# Patient Record
Sex: Female | Born: 1965 | Race: White | Hispanic: No | Marital: Single | Smoking: Former smoker
Health system: Southern US, Community
[De-identification: ages and names within clinical notes are randomized; demographics above are authoritative.]

## PROBLEM LIST (undated history)

## (undated) DIAGNOSIS — B019 Varicella without complication: Secondary | ICD-10-CM

## (undated) DIAGNOSIS — T7840XA Allergy, unspecified, initial encounter: Secondary | ICD-10-CM

## (undated) DIAGNOSIS — K759 Inflammatory liver disease, unspecified: Secondary | ICD-10-CM

## (undated) DIAGNOSIS — E785 Hyperlipidemia, unspecified: Secondary | ICD-10-CM

## (undated) HISTORY — DX: Varicella without complication: B01.9

## (undated) HISTORY — DX: Inflammatory liver disease, unspecified: K75.9

## (undated) HISTORY — DX: Allergy, unspecified, initial encounter: T78.40XA

## (undated) HISTORY — DX: Hyperlipidemia, unspecified: E78.5

## (undated) HISTORY — PX: BREAST BIOPSY: SHX20

---
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
## 1998-03-13 ENCOUNTER — Other Ambulatory Visit: Admission: RE | Admit: 1998-03-13 | Discharge: 1998-03-13 | Payer: Self-pay | Admitting: Gynecology

## 1999-10-19 ENCOUNTER — Other Ambulatory Visit: Admission: RE | Admit: 1999-10-19 | Discharge: 1999-10-19 | Payer: Self-pay | Admitting: Gynecology

## 2002-01-04 ENCOUNTER — Other Ambulatory Visit: Admission: RE | Admit: 2002-01-04 | Discharge: 2002-01-04 | Payer: Self-pay | Admitting: Gynecology

## 2004-03-03 ENCOUNTER — Other Ambulatory Visit: Admission: RE | Admit: 2004-03-03 | Discharge: 2004-03-03 | Payer: Self-pay | Admitting: Gynecology

## 2005-07-19 ENCOUNTER — Other Ambulatory Visit: Admission: RE | Admit: 2005-07-19 | Discharge: 2005-07-19 | Payer: Self-pay | Admitting: Gynecology

## 2013-07-04 ENCOUNTER — Other Ambulatory Visit: Payer: Self-pay | Admitting: Gynecology

## 2013-07-04 DIAGNOSIS — R928 Other abnormal and inconclusive findings on diagnostic imaging of breast: Secondary | ICD-10-CM

## 2013-07-12 ENCOUNTER — Ambulatory Visit
Admission: RE | Admit: 2013-07-12 | Discharge: 2013-07-12 | Disposition: A | Discharge status: Home / Self Care | Payer: BC Managed Care – PPO | Source: Ambulatory Visit | Attending: Gynecology | Admitting: Gynecology

## 2013-07-12 ENCOUNTER — Other Ambulatory Visit: Payer: Self-pay | Admitting: Gynecology

## 2013-07-12 DIAGNOSIS — R928 Other abnormal and inconclusive findings on diagnostic imaging of breast: Secondary | ICD-10-CM

## 2013-07-18 ENCOUNTER — Ambulatory Visit
Admission: RE | Admit: 2013-07-18 | Discharge: 2013-07-18 | Disposition: A | Discharge status: Home / Self Care | Payer: BC Managed Care – PPO | Source: Ambulatory Visit | Attending: Gynecology | Admitting: Gynecology

## 2013-07-18 DIAGNOSIS — R928 Other abnormal and inconclusive findings on diagnostic imaging of breast: Secondary | ICD-10-CM

## 2013-07-27 ENCOUNTER — Telehealth: Payer: Self-pay | Admitting: Internal Medicine

## 2013-07-27 NOTE — Telephone Encounter (Signed)
Pt request to be Dr. Alwyn RenHopper new pt. Pt was refer from Dr. Lodema HongScott Bowie and she was [facility]. Please advise.

## 2013-07-27 NOTE — Telephone Encounter (Signed)
OK  New patient to evaluate lipids

## 2013-07-27 NOTE — Telephone Encounter (Signed)
Pt will be called to schedule an appt.//AB/CMA

## 2013-08-03 ENCOUNTER — Encounter: Payer: Self-pay | Admitting: Internal Medicine

## 2013-08-03 ENCOUNTER — Ambulatory Visit (INDEPENDENT_AMBULATORY_CARE_PROVIDER_SITE_OTHER): Payer: BC Managed Care – PPO | Admitting: Internal Medicine

## 2013-08-03 VITALS — BP 120/68 | HR 62 | Temp 98.4°F | Resp 12 | Ht 65.25 in | Wt 146.6 lb

## 2013-08-03 DIAGNOSIS — E785 Hyperlipidemia, unspecified: Secondary | ICD-10-CM | POA: Insufficient documentation

## 2013-08-03 NOTE — Assessment & Plan Note (Addendum)
NMR Lipoprofile Panel CRP TSH

## 2013-08-03 NOTE — Patient Instructions (Addendum)
The best dietary  information on cholesterol is Dr Gildardo GriffesWillett's book Eat, Drink & Be Healthy. Cardiovascular exercise is recommended 30-45 minutes 3-4 times per week.

## 2013-08-03 NOTE — Progress Notes (Signed)
Pre visit review using our clinic review tool, if applicable. No additional management support is needed unless otherwise documented below in the visit note. 

## 2013-08-03 NOTE — Progress Notes (Signed)
   Subjective:    Patient ID: Jennifer Hughes, female    DOB: Oct 31, 1965, 48 y.o.   MRN: [id number]  HPI  Dr Lodema HongScott Bowie requested an assessment of elevated lipids. He did recommend that she take a statin; she was reluctant to do so.  Typically her HDL has been extremely high; the most recent HDL is 120. Her LDL was 170 with a total cholesterol of 302.  Her mother has a history of hyperlipidemia and may be on medications. Her father had a heart attack in his 5360s. There is no family history of CVA.Marland Kitchen.    Review of Systems A heart healthy diet is followed; exercise encompasses > 60 minutes 3-5  times per week as high level CVE & weights without symptoms.  Family history is negative for premature coronary disease. Advanced cholesterol testing not done to date. No statin to date.  Low dose ASA not taken Specifically denied are  chest pain, palpitations, dyspnea, or claudication.       Objective:   Physical Exam Appears healthy and well-nourished & in no acute distress  EOMI; fundi benign  No carotid bruits are present.No neck pain distention present at 10 - 15 degrees. Thyroid normal to palpation  Heart rhythm and rate are normal.Grade 1/6 systolic murmur Chest is clear with no increased work of breathing  Aorta palpable but there is no evidence of aortic aneurysm or renal artery bruits  Abdomen soft with no organomegaly or masses. No HJR  No clubbing, cyanosis or edema present.  Pedal pulses are intact   No ischemic skin changes are present . Nails healthy  Alert and oriented. Strength, tone, DTRs reflexes = but 1/2+ @ knees          Assessment & Plan:  See Current Assessment & Plan in Problem List under specific Diagnosis

## 2013-08-04 ENCOUNTER — Encounter: Payer: Self-pay | Admitting: Internal Medicine

## 2013-09-14 ENCOUNTER — Other Ambulatory Visit (INDEPENDENT_AMBULATORY_CARE_PROVIDER_SITE_OTHER): Payer: BC Managed Care – PPO

## 2013-09-14 ENCOUNTER — Other Ambulatory Visit: Payer: Self-pay | Admitting: Internal Medicine

## 2013-09-14 DIAGNOSIS — E785 Hyperlipidemia, unspecified: Secondary | ICD-10-CM

## 2013-09-14 LAB — NMR LIPOPROFILE WITH LIPIDS
CHOLESTEROL, TOTAL: 213 mg/dL — AB (ref <200)
HDL Particle Number: 36.6 umol/L (ref >=30.5)
HDL Size: 10.5 nm (ref >=9.2)
HDL-C: 90 mg/dL (ref >=40)
LARGE VLDL-P: 1 nmol/L (ref <=2.7)
LDL (calc): 111 mg/dL — ABNORMAL HIGH (ref <100)
LDL Particle Number: 885 nmol/L (ref <1000)
LDL Size: 21.8 nm (ref >20.5)
Large HDL-P: 17.5 umol/L (ref >=4.8)
Small LDL Particle Number: <90 nmol/L (ref <=527)
TRIGLYCERIDES: 60 mg/dL (ref <150)
VLDL Size: 50.1 nm — ABNORMAL HIGH (ref <=46.6)

## 2013-09-14 LAB — C-REACTIVE PROTEIN: CRP: <0.5 mg/dL (ref 0.5–20.0)

## 2013-09-14 LAB — TSH: TSH: 3.27 u[IU]/mL (ref 0.35–5.50)

## 2013-09-16 ENCOUNTER — Encounter: Payer: Self-pay | Admitting: Internal Medicine

## 2013-09-17 ENCOUNTER — Encounter: Payer: Self-pay | Admitting: *Deleted

## 2013-09-17 ENCOUNTER — Telehealth: Payer: Self-pay | Admitting: *Deleted

## 2013-09-17 NOTE — Telephone Encounter (Signed)
Message copied by Baldwin JamaicaJOHNSON, Eriverto Byrnes G on Mon Sep 17, 2013  9:48 AM ------      Message from: Pecola LawlessHOPPER, WILLIAM F      Created: Sun Sep 16, 2013 10:55 AM       Risk of premature heart attack or stroke increases as LDL or BAD cholesterol rises,especially if there is family history of heart attack in males before 1655 or women before 3365.       Based on this advanced testing, your LDL goal is < 160 , ideally < 130. Your present LDL is ideal without any increased long term heart attack or stroke risk. Congratulations! Hopp ------

## 2013-09-17 NOTE — Progress Notes (Signed)
Letter mailed with results. 

## 2013-09-17 NOTE — Telephone Encounter (Signed)
Letter sent with normal test results.  ?

## 2014-07-26 ENCOUNTER — Other Ambulatory Visit: Payer: Self-pay

## 2014-07-29 ENCOUNTER — Other Ambulatory Visit: Payer: Self-pay | Admitting: Obstetrics and Gynecology

## 2014-07-29 DIAGNOSIS — R921 Mammographic calcification found on diagnostic imaging of breast: Secondary | ICD-10-CM

## 2014-07-30 LAB — CYTOLOGY - PAP

## 2014-08-09 ENCOUNTER — Ambulatory Visit
Admission: RE | Admit: 2014-08-09 | Discharge: 2014-08-09 | Disposition: A | Discharge status: Home / Self Care | Payer: BLUE CROSS/BLUE SHIELD | Source: Ambulatory Visit | Attending: Obstetrics and Gynecology | Admitting: Obstetrics and Gynecology

## 2014-08-09 DIAGNOSIS — R921 Mammographic calcification found on diagnostic imaging of breast: Secondary | ICD-10-CM

## 2015-09-15 ENCOUNTER — Other Ambulatory Visit: Payer: Self-pay | Admitting: Obstetrics and Gynecology

## 2015-09-16 LAB — CYTOLOGY - PAP

## 2016-09-29 ENCOUNTER — Other Ambulatory Visit: Payer: Self-pay | Admitting: Obstetrics and Gynecology

## 2016-09-30 LAB — CYTOLOGY - PAP

## 2016-12-17 ENCOUNTER — Encounter (HOSPITAL_BASED_OUTPATIENT_CLINIC_OR_DEPARTMENT_OTHER): Payer: Self-pay | Admitting: Emergency Medicine

## 2016-12-17 ENCOUNTER — Emergency Department (HOSPITAL_BASED_OUTPATIENT_CLINIC_OR_DEPARTMENT_OTHER)
Admission: EM | Admit: 2016-12-17 | Discharge: 2016-12-17 | Disposition: A | Discharge status: Home / Self Care | Payer: BLUE CROSS/BLUE SHIELD | Attending: Emergency Medicine | Admitting: Emergency Medicine

## 2016-12-17 DIAGNOSIS — Z87891 Personal history of nicotine dependence: Secondary | ICD-10-CM | POA: Insufficient documentation

## 2016-12-17 DIAGNOSIS — R197 Diarrhea, unspecified: Secondary | ICD-10-CM | POA: Diagnosis present

## 2016-12-17 DIAGNOSIS — A09 Infectious gastroenteritis and colitis, unspecified: Secondary | ICD-10-CM | POA: Diagnosis not present

## 2016-12-17 DIAGNOSIS — Z79899 Other long term (current) drug therapy: Secondary | ICD-10-CM | POA: Diagnosis not present

## 2016-12-17 LAB — COMPREHENSIVE METABOLIC PANEL
ALBUMIN: 3.9 g/dL (ref 3.5–5.0)
ALK PHOS: 63 U/L (ref 38–126)
ALT: 19 U/L (ref 14–54)
AST: 23 U/L (ref 15–41)
Anion gap: 9 (ref 5–15)
BUN: 14 mg/dL (ref 6–20)
CALCIUM: 9 mg/dL (ref 8.9–10.3)
CO2: 25 mmol/L (ref 22–32)
Chloride: 102 mmol/L (ref 101–111)
Creatinine, Ser: 0.81 mg/dL (ref 0.44–1.00)
GFR calc Af Amer: >60 mL/min (ref >60)
GFR calc non Af Amer: >60 mL/min (ref >60)
GLUCOSE: 89 mg/dL (ref 65–99)
Potassium: 3.6 mmol/L (ref 3.5–5.1)
Sodium: 136 mmol/L (ref 135–145)
Total Bilirubin: 0.5 mg/dL (ref 0.3–1.2)
Total Protein: 6.7 g/dL (ref 6.5–8.1)

## 2016-12-17 LAB — CBC WITH DIFFERENTIAL/PLATELET
BASOS PCT: 0 %
Basophils Absolute: 0 10*3/uL (ref 0.0–0.1)
EOS ABS: 0.5 10*3/uL (ref 0.0–0.7)
Eosinophils Relative: 6 %
HEMATOCRIT: 38.6 % (ref 36.0–46.0)
Hemoglobin: 13.6 g/dL (ref 12.0–15.0)
Lymphocytes Relative: 37 %
Lymphs Abs: 2.8 10*3/uL (ref 0.7–4.0)
MCH: 32.6 pg (ref 26.0–34.0)
MCHC: 35.2 g/dL (ref 30.0–36.0)
MCV: 92.6 fL (ref 78.0–100.0)
MONOS PCT: 10 %
Monocytes Absolute: 0.7 10*3/uL (ref 0.1–1.0)
NEUTROS ABS: 3.6 10*3/uL (ref 1.7–7.7)
NEUTROS PCT: 47 %
Platelets: 210 10*3/uL (ref 150–400)
RBC: 4.17 MIL/uL (ref 3.87–5.11)
RDW: 12.1 % (ref 11.5–15.5)
WBC: 7.6 10*3/uL (ref 4.0–10.5)

## 2016-12-17 LAB — LIPASE, BLOOD: Lipase: 30 U/L (ref 11–51)

## 2016-12-17 MED ORDER — SODIUM CHLORIDE 0.9 % IV BOLUS (SEPSIS)
1000.0000 mL | Freq: Once | INTRAVENOUS | Status: AC
Start: 2016-12-17 — End: 2016-12-17
  Administered 2016-12-17: 1000 mL via INTRAVENOUS

## 2016-12-17 MED ORDER — DICYCLOMINE HCL 20 MG PO TABS
20.0000 mg | ORAL_TABLET | Freq: Once | ORAL | Status: DC
  Filled 2016-12-17: qty 1

## 2016-12-17 MED ORDER — ONDANSETRON 4 MG PO TBDP: 4.0000 mg | ORAL_TABLET | Freq: Three times a day (TID) | ORAL | 0 refills | Status: DC | PRN

## 2016-12-17 MED ORDER — ONDANSETRON HCL 4 MG/2ML IJ SOLN
4.0000 mg | Freq: Once | INTRAMUSCULAR | Status: AC
  Administered 2016-12-17: 4 mg via INTRAVENOUS
  Filled 2016-12-17: qty 2

## 2016-12-17 MED ORDER — DICYCLOMINE HCL 10 MG PO CAPS
20.0000 mg | ORAL_CAPSULE | Freq: Once | ORAL | Status: AC
  Administered 2016-12-17: 20 mg via ORAL

## 2016-12-17 MED ORDER — CIPROFLOXACIN HCL 500 MG PO TABS: 500.0000 mg | ORAL_TABLET | Freq: Two times a day (BID) | ORAL | 0 refills | Status: AC

## 2016-12-17 MED ORDER — SODIUM CHLORIDE 0.9 % IV BOLUS (SEPSIS): 500.0000 mL | Freq: Once | INTRAVENOUS | Status: DC

## 2016-12-17 MED ORDER — AZITHROMYCIN 250 MG PO TABS
1000.0000 mg | ORAL_TABLET | Freq: Once | ORAL | Status: AC
  Administered 2016-12-17: 1000 mg via ORAL
  Filled 2016-12-17: qty 4

## 2016-12-17 MED ORDER — DICYCLOMINE HCL 10 MG PO CAPS
ORAL_CAPSULE | ORAL | Status: AC
  Filled 2016-12-17: qty 1

## 2016-12-17 NOTE — ED Provider Notes (Signed)
[facility] Provider Note   CSN: [id number] Arrival date & time: 12/17/16  1652   By signing my name below, I, Clarisse Gouge, attest that this documentation has been prepared under the direction and in the presence of Shaune Pollack, MD. Electronically signed, Clarisse Gouge, ED Scribe. 12/17/16. 6:36 PM.   History   Chief Complaint Chief Complaint  Patient presents with  . Diarrhea   The history is provided by the patient and medical records. No language interpreter was used.    Jennifer Hughes is a 51 y.o. female presenting to the Emergency Department concerning abdominal cramping x ~5 days. She describes intermittent, 5/10, bilateral, upper to middle abdominal pains. Fatigue, decreased appetite, N/V x 5 days, diarrhea x 3 days with some blood streaks noted. Pt [place] returned from [place] at 1-2 AM 5 days ago. She states she was there x 7 days and she was bitten by many mosquitos during the vacation. No medication use during aforementioned trip. She states she swam in a pool and the ocean. She denies known sick contacts at home or otherwise, but she states 2 of her travel mates had similar symptoms x 1 day that subsided during the trip. No fevers, hematemesis, dysuria, hematuria or frequency noted. No other complaints at this time.   Past Medical History:  Diagnosis Date  . Allergy    hayfever   . Chickenpox   . Hepatitis   . Hyperlipidemia     Patient Active Problem List   Diagnosis Date Noted  . Other and unspecified hyperlipidemia 08/03/2013    Past Surgical History:  Procedure Laterality Date  . BREAST BIOPSY    . TONSILLECTOMY AND ADENOIDECTOMY  1973    OB History    No data available       Home Medications    Prior to Admission medications   Medication Sig Start Date End Date Taking? Authorizing Provider  valACYclovir (VALTREX) 500 MG tablet Take 500 mg by mouth 2 (two) times daily.   Yes [provider]  azithromycin  (ZITHROMAX) 250 MG tablet Take 250 mg by mouth daily.    [provider]  ciprofloxacin (CIPRO) 500 MG tablet Take 1 tablet (500 mg total) by mouth 2 (two) times daily. 12/17/16 12/20/16  Shaune Pollack, MD  Misc Natural Products (CHOLESTEROL SUPPORT PO) Take 3 capsules by mouth daily.    [provider]  Multiple Vitamins-Minerals (MULTIPLE VITAMINS/WOMENS PO) Take 1 capsule by mouth daily.    [provider]  Omega-3 Fatty Acids (OMEGA-3 FISH OIL PO) Take 3 capsules by mouth daily.    [provider]  ondansetron (ZOFRAN ODT) 4 MG disintegrating tablet Take 1 tablet (4 mg total) by mouth every 8 (eight) hours as needed for nausea or vomiting. 12/17/16   Shaune Pollack, MD    Family History Family History  Problem Relation Age of Onset  . Hyperlipidemia Mother   . Cancer Mother        gynecologic  . Heart attack Father        in 90s  . Diabetes Maternal Grandmother   . Stroke Neg Hx     Social History Social History  Substance Use Topics  . Smoking status: Former Smoker    Quit date: 08/03/1998  . Smokeless tobacco: Never Used     Comment: smoked 1998-2000, up to 10 cigarettes / week  . Alcohol use 4.2 oz/week    7 Glasses of wine per week     Comment: Wine  1 glass/day     Allergies   Patient has no known allergies.   Review of Systems Review of Systems  Constitutional: Positive for appetite change and fatigue. Negative for fever.  Gastrointestinal: Positive for abdominal pain, blood in stool, diarrhea, nausea and vomiting.  Genitourinary: Negative for dysuria, frequency and hematuria.  All other systems reviewed and are negative.    Physical Exam Updated Vital Signs BP 121/77 (BP Location: Right Arm)   Pulse 67   Temp 99 F (37.2 C) (Oral)   Resp 18   Ht 5\' 6"  (1.676 m)   Wt 152 lb (68.9 kg)   LMP 07/22/2016   SpO2 99%   BMI 24.53 kg/m   Physical Exam  Constitutional: She is oriented to person, place, and time. She  appears well-developed and well-nourished. No distress.  HENT:  Head: Normocephalic and atraumatic.  Mouth/Throat: Mucous membranes are dry (mildly dry mucous membrane).  Eyes: Conjunctivae are normal.  Neck: Neck supple.  Cardiovascular: Normal rate, regular rhythm and normal heart sounds.  Exam reveals no friction rub.   No murmur heard. Pulmonary/Chest: Effort normal and breath sounds normal. No respiratory distress. She has no wheezes. She has no rales.  Abdominal: Soft. She exhibits no distension. Bowel sounds are increased. There is no tenderness. There is no rebound and no guarding.  Musculoskeletal: She exhibits no edema.  Neurological: She is alert and oriented to person, place, and time. She exhibits normal muscle tone.  Skin: Skin is warm. Capillary refill takes less than 2 seconds.  Psychiatric: She has a normal mood and affect.  Nursing note and vitals reviewed.    ED Treatments / Results  DIAGNOSTIC STUDIES: Oxygen Saturation is 99% on RA, NL by my interpretation.    COORDINATION OF CARE: 6:26 PM-Discussed next steps with pt. Pt verbalized understanding and is agreeable with the plan. Will order fluids and medication.   Labs (all labs ordered are listed, but only abnormal results are displayed) Labs Reviewed  CBC WITH DIFFERENTIAL/PLATELET  COMPREHENSIVE METABOLIC PANEL  LIPASE, BLOOD    EKG  EKG Interpretation None       Radiology No results found.  Procedures Procedures (including critical care time)  Emergency Ultrasound Study:   Angiocath insertion Performed by: Dollene Cleveland Consent: Verbal consent/emergent consent obtained. Risks and benefits: risks, benefits and alternatives were discussed Immediately prior to procedure the correct patient, procedure, equipment, support staff and site/side marked as needed.  Indication: difficult IV access Preparation: Patient was prepped and draped in the usual sterile fashion. Sterile gel was used for  this procedure and the ultrasound probe was sterilized prior to use. Vein Location: Right forearm vein was visualized during assessment for potential access sites and was found to be patent/ easily compressed with linear ultrasound.  The needle was visualized with real-time ultrasound and guided into the vein. Gauge: 20  Image saved and stored.  Normal blood return.   Patient tolerance: Patient tolerated the procedure well with no immediate complications.       Medications Ordered in ED Medications  sodium chloride 0.9 % bolus 1,000 mL (0 mLs Intravenous Stopped 12/17/16 1936)  ondansetron (ZOFRAN) injection 4 mg (4 mg Intravenous Given 12/17/16 1842)  dicyclomine (BENTYL) capsule 20 mg (20 mg Oral Given 12/17/16 1850)  azithromycin (ZITHROMAX) tablet 1,000 mg (1,000 mg Oral Given 12/17/16 1936)     Initial Impression / Assessment and Plan / ED Course  I have reviewed the triage vital signs and the nursing notes.  Pertinent  labs & imaging results that were available during my care of the patient were reviewed by me and considered in my medical decision making (see chart for details).     51 year old female here with watery diarrhea after recent travel to [place]. On exam, the patient is very well-appearing, though mildly dehydrated. She has no urinary symptoms. CBC, CMP, and lipase are within normal limits. She feels better after IV fluids. I suspect patient likely has traveler's diarrhea or Escherichia coli infection. No evidence of significant invasive infection. No evidence sepsis. Given the absence of any leukocytosis or tenderness on exam, I do not feel CT imaging is indicated. Will give azithromycin as well as 3 day course of Cipro and discharged with outpatient follow-up.  Final Clinical Impressions(s) / ED Diagnoses   Final diagnoses:  Traveler's diarrhea    New Prescriptions Discharge Medication List as of 12/17/2016  8:25 PM    START taking these medications   Details    ciprofloxacin (CIPRO) 500 MG tablet Take 1 tablet (500 mg total) by mouth 2 (two) times daily., Starting Fri 12/17/2016, Until Mon 12/20/2016, Print        I personally performed the services described in this documentation, which was scribed in my presence. The recorded information has been reviewed and is accurate.    Shaune PollackIsaacs, Finis Hendricksen, MD 12/18/16 [phone]

## 2016-12-17 NOTE — ED Triage Notes (Signed)
pateint states that she was in [place] last week. On Sunday when she got back she was fine and then on Monday she had abdominal cramping and N/V - wed she started with the darrhea. She is concerned that she picked something up in [place]

## 2016-12-17 NOTE — ED Notes (Signed)
ED Provider at bedside. 

## 2016-12-17 NOTE — ED Notes (Signed)
Came back from [place] Saturday night.  Monday, felt more tired and stomach cramping accompanied by diarrhea.  3 loose watery stools within 24 hours.  Still c/o cramping and took imodium Wednesday and yesterday with no relief.

## 2020-02-27 LAB — HM PAP SMEAR: HM Pap smear: NORMAL

## 2020-02-29 LAB — HM MAMMOGRAPHY

## 2021-02-19 ENCOUNTER — Telehealth: Payer: Self-pay | Admitting: *Deleted

## 2021-02-19 ENCOUNTER — Ambulatory Visit (INDEPENDENT_AMBULATORY_CARE_PROVIDER_SITE_OTHER): Payer: Managed Care, Other (non HMO) | Admitting: Family

## 2021-02-19 ENCOUNTER — Encounter: Payer: Self-pay | Admitting: Family

## 2021-02-19 ENCOUNTER — Other Ambulatory Visit: Payer: Self-pay

## 2021-02-19 VITALS — BP 112/60 | HR 60 | Temp 97.9°F | Resp 16 | Ht 66.0 in | Wt 168.6 lb

## 2021-02-19 DIAGNOSIS — Z13220 Encounter for screening for lipoid disorders: Secondary | ICD-10-CM | POA: Diagnosis not present

## 2021-02-19 DIAGNOSIS — Z0000 Encounter for general adult medical examination without abnormal findings: Secondary | ICD-10-CM | POA: Diagnosis not present

## 2021-02-19 NOTE — Progress Notes (Signed)
Jennifer Hughes is a 55 y.o. female with the following history as recorded in Epic[employer]:  Patient Active Problem List   Diagnosis Date Noted   Other and unspecified hyperlipidemia 08/03/2013    Current Outpatient Medications  Medication Sig Dispense Refill   B Complex Vitamins (B COMPLEX PO) Take by mouth.     Multiple Vitamins-Minerals (MULTIPLE VITAMINS/WOMENS PO) Take 1 capsule by mouth daily.     Specialty Vitamins Products (MM BIOTIN/KERATIN PO) Take by mouth.     valACYclovir (VALTREX) 500 MG tablet Take 500 mg by mouth daily.     VITAMIN E BLEND PO Take by mouth.     No current facility-administered medications for this visit.    Allergies: Patient has no known allergies.  Past Medical History:  Diagnosis Date   Allergy    hayfever    Chickenpox    Hepatitis    Hyperlipidemia     Past Surgical History:  Procedure Laterality Date   BREAST BIOPSY     TONSILLECTOMY AND ADENOIDECTOMY  1973    Family History  Problem Relation Age of Onset   Hyperlipidemia Mother    Cancer Mother        gynecologic   Heart attack Father        in 1s   Diabetes Maternal Grandmother    Stroke Neg Hx     Social History   Tobacco Use   Smoking status: Former    Types: Cigarettes    Quit date: 08/03/1998    Years since quitting: 22.5   Smokeless tobacco: Never   Tobacco comments:    smoked 1998-2000, up to 10 cigarettes / week  Substance Use Topics   Alcohol use: Yes    Alcohol/week: 7.0 standard drinks    Types: 7 Glasses of wine per week    Comment: Wine 1 glass/day    Subjective:  Presents as a new patient; no acute concerns- decided it was time to have a primary care provider; Sees GYN regularly- Dr. Rogue Bussing; Colonoscopy done at age 4- okay for 10 year repeat;  Up to date on dental exams; overdue for eye exam; Exercises 3 x per week with personal trainer;  Review of Systems  Constitutional: Negative.   HENT: Negative.    Eyes: Negative.   Respiratory: Negative.     Cardiovascular: Negative.   Gastrointestinal: Negative.   Genitourinary: Negative.   Musculoskeletal: Negative.   Skin: Negative.   Neurological: Negative.   Endo/Heme/Allergies: Negative.   Psychiatric/Behavioral: Negative.          Objective:  Vitals:   02/19/21 1409  BP: 112/60  Pulse: 60  Resp: 16  Temp: 97.9 F (36.6 C)  TempSrc: Oral  SpO2: 98%  Weight: 168 lb 9.6 oz (76.5 kg)  Height: _0  (1.676 m)    General: Well developed, well nourished, in no acute distress  Skin : Warm and dry.  Head: Normocephalic and atraumatic  Eyes: Sclera and conjunctiva clear; pupils round and reactive to light; extraocular movements intact  Ears: External normal; canals clear; tympanic membranes normal  Oropharynx: Pink, supple. No suspicious lesions  Neck: Supple without thyromegaly, adenopathy  Lungs: Respirations unlabored; clear to auscultation bilaterally without wheeze, rales, rhonchi  CVS exam: normal rate and regular rhythm.  Abdomen: Soft; nontender; nondistended; normoactive bowel sounds; no masses or hepatosplenomegaly  Musculoskeletal: No deformities; no active joint inflammation  Extremities: No edema, cyanosis, clubbing  Vessels: Symmetric bilaterally  Neurologic: Alert and oriented; speech intact; face symmetrical; moves all  extremities well; CNII-XII intact without focal deficit   Assessment:  1. PE (physical exam), annual   2. Lipid screening     Plan:  Age appropriate preventive healthcare needs addressed; encouraged regular eye doctor and dental exams; encouraged regular exercise; she will return for fasting labs and follow-up to be determined; Discussed vaccines including Tdap, Shingrix and flu; she defers at this time; Congratulated patient on commitment to her health;   This visit occurred during the SARS-CoV-2 public health emergency.  Safety protocols were in place, including screening questions prior to the visit, additional usage of staff PPE, and  extensive cleaning of exam room while observing appropriate contact time as indicated for disinfecting solutions.    Return for fasting labs at patient's convenience.  Orders Placed This Encounter  Procedures   CBC with Differential/Platelet    Standing Status:   Future    Standing Expiration Date:   02/19/2022   Comp Met (CMET)    Standing Status:   Future    Standing Expiration Date:   02/19/2022   Lipid panel    Standing Status:   Future    Standing Expiration Date:   02/19/2022   TSH    Standing Status:   Future    Standing Expiration Date:   02/19/2022     Requested Prescriptions    No prescriptions requested or ordered in this encounter

## 2021-02-19 NOTE — Telephone Encounter (Signed)
Pt is scheduled for lab appointment tomorrow morning. I do not see future orders.  Please place orders if appropriate.

## 2021-02-20 ENCOUNTER — Other Ambulatory Visit: Payer: Self-pay | Admitting: Family

## 2021-02-20 ENCOUNTER — Encounter: Payer: Self-pay | Admitting: Family

## 2021-02-20 ENCOUNTER — Other Ambulatory Visit (INDEPENDENT_AMBULATORY_CARE_PROVIDER_SITE_OTHER): Payer: Managed Care, Other (non HMO)

## 2021-02-20 DIAGNOSIS — Z0000 Encounter for general adult medical examination without abnormal findings: Secondary | ICD-10-CM

## 2021-02-20 DIAGNOSIS — Z13220 Encounter for screening for lipoid disorders: Secondary | ICD-10-CM | POA: Diagnosis not present

## 2021-02-20 DIAGNOSIS — E7800 Pure hypercholesterolemia, unspecified: Secondary | ICD-10-CM

## 2021-02-20 LAB — LIPID PANEL
Cholesterol: 343 mg/dL — ABNORMAL HIGH (ref 0–200)
HDL: 98.7 mg/dL (ref >39.00)
LDL Cholesterol: 233 mg/dL — ABNORMAL HIGH (ref 0–99)
NonHDL: 243.98
Total CHOL/HDL Ratio: 3
Triglycerides: 55 mg/dL (ref 0.0–149.0)
VLDL: 11 mg/dL (ref 0.0–40.0)

## 2021-02-20 LAB — COMPREHENSIVE METABOLIC PANEL
ALT: 23 U/L (ref 0–35)
AST: 28 U/L (ref 0–37)
Albumin: 4.4 g/dL (ref 3.5–5.2)
Alkaline Phosphatase: 80 U/L (ref 39–117)
BUN: 13 mg/dL (ref 6–23)
CO2: 26 mEq/L (ref 19–32)
Calcium: 9.7 mg/dL (ref 8.4–10.5)
Chloride: 101 mEq/L (ref 96–112)
Creatinine, Ser: 0.91 mg/dL (ref 0.40–1.20)
GFR: 70.14 mL/min (ref >60.00)
Glucose, Bld: 84 mg/dL (ref 70–99)
Potassium: 4.4 mEq/L (ref 3.5–5.1)
Sodium: 136 mEq/L (ref 135–145)
Total Bilirubin: 0.8 mg/dL (ref 0.2–1.2)
Total Protein: 7 g/dL (ref 6.0–8.3)

## 2021-02-20 LAB — CBC WITH DIFFERENTIAL/PLATELET
Basophils Absolute: 0.1 10*3/uL (ref 0.0–0.1)
Basophils Relative: 1.1 % (ref 0.0–3.0)
Eosinophils Absolute: 0.3 10*3/uL (ref 0.0–0.7)
Eosinophils Relative: 4.4 % (ref 0.0–5.0)
HCT: 39.9 % (ref 36.0–46.0)
Hemoglobin: 13.6 g/dL (ref 12.0–15.0)
Lymphocytes Relative: 34.4 % (ref 12.0–46.0)
Lymphs Abs: 2.2 10*3/uL (ref 0.7–4.0)
MCHC: 34.1 g/dL (ref 30.0–36.0)
MCV: 95.7 fl (ref 78.0–100.0)
Monocytes Absolute: 0.4 10*3/uL (ref 0.1–1.0)
Monocytes Relative: 6.3 % (ref 3.0–12.0)
Neutro Abs: 3.4 10*3/uL (ref 1.4–7.7)
Neutrophils Relative %: 53.8 % (ref 43.0–77.0)
Platelets: 248 10*3/uL (ref 150.0–400.0)
RBC: 4.17 Mil/uL (ref 3.87–5.11)
RDW: 12.3 % (ref 11.5–15.5)
WBC: 6.3 10*3/uL (ref 4.0–10.5)

## 2021-02-20 LAB — TSH: TSH: 2.74 u[IU]/mL (ref 0.35–5.50)

## 2021-02-20 NOTE — Progress Notes (Signed)
Mammogram and PAP abstraction done.

## 2021-02-26 ENCOUNTER — Encounter: Payer: Self-pay | Admitting: Family

## 2021-07-13 ENCOUNTER — Other Ambulatory Visit: Payer: Self-pay

## 2021-07-13 ENCOUNTER — Ambulatory Visit (HOSPITAL_BASED_OUTPATIENT_CLINIC_OR_DEPARTMENT_OTHER): Payer: Managed Care, Other (non HMO) | Admitting: Internal Medicine

## 2021-07-13 ENCOUNTER — Encounter (HOSPITAL_BASED_OUTPATIENT_CLINIC_OR_DEPARTMENT_OTHER): Payer: Self-pay | Admitting: Internal Medicine

## 2021-07-13 VITALS — BP 115/64 | HR 57 | Ht 66.0 in | Wt 161.0 lb

## 2021-07-13 DIAGNOSIS — E7800 Pure hypercholesterolemia, unspecified: Secondary | ICD-10-CM

## 2021-07-13 MED ORDER — ROSUVASTATIN CALCIUM 20 MG PO TABS: 20.0000 mg | ORAL_TABLET | Freq: Every day | ORAL | 3 refills | Status: DC

## 2021-07-13 NOTE — Progress Notes (Signed)
LIPID CLINIC CONSULT NOTE  Chief Complaint:  Manage dyslipidemia  Primary Care Physician: Marrian Salvage, [place]  Primary Cardiologist:  None  HPI:  Jennifer Hughes is a 56 y.o. female who is being seen today for the evaluation of anemia at the request of Marrian Salvage,*.  Is a pleasant 56 year old female kindly referred for evaluation and management of dyslipidemia.  She was initially noted to have high cholesterol number of years ago and referred for further evaluation.  She saw Dr. Linna Darner, local primary care provider who ordered an NMR LipoProfile.  This demonstrated an LDL particle number of 885, LDL-C of 111, HDL of 90 and triglycerides 60.  Total cholesterol 213.  At the time it was felt that her ratio was cardioprotective and no additional therapy was recommended.  She is currently and has been on no medication to treat her lipids, in fact she is on no medications at all.  She says she is feels that she is fairly healthy.  She is active and exercises regularly.  She is maintaining a normal body weight and has no history of hypertension or prediabetes, etc.  Recently she had repeat lipids, however which were concerning showing total cholesterol 343, triglycerides 55, HDL 98 and LDL 233.  Based on this finding, she was referred for further evaluation.  She does report history of heart disease in her family including her father who had MI in his 5s and mother who had a stroke in probably her 82s.  I believe she had high cholesterol.  PMHx:  Past Medical History:  Diagnosis Date   Allergy    hayfever    Chickenpox    Hepatitis    Hyperlipidemia     Past Surgical History:  Procedure Laterality Date   BREAST BIOPSY     TONSILLECTOMY AND ADENOIDECTOMY  1973    FAMHx:  Family History  Problem Relation Age of Onset   Hyperlipidemia Mother    Cancer Mother        gynecologic   Heart attack Father        in 61s   Diabetes Maternal Grandmother    Stroke Neg Hx      SOCHx:   reports that she quit smoking about 22 years ago. Her smoking use included cigarettes. She has never used smokeless tobacco. She reports current alcohol use of about 7.0 standard drinks per week. She reports that she does not use drugs.  ALLERGIES:  Allergies  Allergen Reactions   Codeine Nausea Only    ROS: Pertinent items noted in HPI and remainder of comprehensive ROS otherwise negative.  HOME MEDS: Current Outpatient Medications on File Prior to Visit  Medication Sig Dispense Refill   Multiple Vitamins-Minerals (MULTIPLE VITAMINS/WOMENS PO) Take 1 capsule by mouth daily.     Specialty Vitamins Products (MM BIOTIN/KERATIN PO) Take by mouth.     valACYclovir (VALTREX) 500 MG tablet Take 500 mg by mouth daily.     No current facility-administered medications on file prior to visit.    LABS/IMAGING: No results found for this or any previous visit (from the past 48 hour(s)). No results found.  LIPID PANEL:    Component Value Date/Time   CHOL 343 (H) 02/20/2021 1019   CHOL 213 (H) 09/14/2013 0823   TRIG 55.0 02/20/2021 1019   TRIG 60 09/14/2013 0823   HDL 98.70 02/20/2021 1019   HDL 90 09/14/2013 0823   CHOLHDL 3 02/20/2021 1019   VLDL 11.0 02/20/2021 1019  LDLCALC 233 (H) 02/20/2021 1019   LDLCALC 111 (H) 09/14/2013 0823    WEIGHTS: Wt Readings from Last 3 Encounters:  07/13/21 161 lb (73 kg)  02/19/21 168 lb 9.6 oz (76.5 kg)  12/17/16 152 lb (68.9 kg)    VITALS: BP 115/64    Pulse (!) 57    Ht 5\' 6"  (1.676 m)    Wt 161 lb (73 kg)    SpO2 99%    BMI 25.99 kg/m   EXAM: General appearance: alert and no distress Neck: no carotid bruit, no JVD, and thyroid not enlarged, symmetric, no tenderness/mass/nodules Lungs: clear to auscultation bilaterally Heart: regular rate and rhythm, S1, S2 normal, no murmur, click, rub or gallop Abdomen: soft, non-tender; bowel sounds normal; no masses,  no organomegaly Extremities: extremities normal, atraumatic, no  cyanosis or edema Pulses: 2+ and symmetric Skin: Skin color, texture, turgor normal. No rashes or lesions Neurologic: Grossly normal Psych: Pleasant  *Examination chaperoned by Sheral Apley, RN.  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   Jennifer Hughes has a marked increase in her lipids compared to a prior lipoprotein test in 2015.  This could possibly be a genetic dyslipidemia.  Generally these do increase over tim.  I am not certain that her HDL cholesterol is cardioprotective.  Multiple studies show that the HDL/LDL ratio concept is somewhat flawed.  I strongly recommended genetic testing as it would be potentially revealing as the cause of her high cholesterol.  She will consider that.  She was interested in a calcium score to determine if there is any premature coronary disease.  I will order that.  I would also recommend starting statin therapy, will prescribe for rosuvastatin 20 mg daily.  Plan repeat lipids in about 3 months and follow-up with me afterwards.  Thanks again for the kind referral.  Pixie Casino, MD, FACC, [place] Director of the [facility] Diplomate of the American Board of Clinical Lipidology Attending Cardiologist  Direct Dial: [phone]   Fax: [phone]  Website:  [email or web address] 07/13/2021, 4:49 PM

## 2021-07-13 NOTE — Patient Instructions (Addendum)
Medication Instructions:  START rosuvastatin (crestor) 20mg  daily  *If you need a refill on your cardiac medications before your next appointment, please call your pharmacy*   Lab Work: FASTING lab work to check cholesterol in 3-4 months  -- complete about 1 week before your next appointment with Dr.   If you have labs (blood work) drawn today and your tests are completely normal, you will receive your results only by: MyChart Message (if you have MyChart) OR A paper copy in the mail If you have any lab test that is abnormal or we need to change your treatment, we will call you to review the results.   Testing/Procedures: Dr. Rennis Golden has ordered a CT coronary calcium score.   Test locations:  [facility] ([address] 3rd Floor [place], [place]) [facility] ([address] [place], [place])   This is $99 out of pocket.   Coronary CalciumScan A coronary calcium scan is an imaging test used to look for deposits of calcium and other fatty materials (plaques) in the inner lining of the blood vessels of the heart (coronary arteries). These deposits of calcium and plaques can partly clog and narrow the coronary arteries without producing any symptoms or warning signs. This puts a person at risk for a heart attack. This test can detect these deposits before symptoms develop. Tell a health care provider about: Any allergies you have. All medicines you are taking, including vitamins, herbs, eye drops, creams, and over-the-counter medicines. Any problems you or family members have had with anesthetic medicines. Any blood disorders you have. Any surgeries you have had. Any medical conditions you have. Whether you are pregnant or may be pregnant. What are the risks? Generally, this is a safe procedure. However, problems may occur, including: Harm to a pregnant woman and her unborn baby. This test involves the use of radiation. Radiation exposure can be  dangerous to a pregnant woman and her unborn baby. If you are pregnant, you generally should not have this procedure done. Slight increase in the risk of cancer. This is because of the radiation involved in the test. What happens before the procedure? No preparation is needed for this procedure. What happens during the procedure? You will undress and remove any jewelry around your neck or chest. You will put on a hospital gown. Sticky electrodes will be placed on your chest. The electrodes will be connected to an electrocardiogram (ECG) machine to record a tracing of the electrical activity of your heart. A CT scanner will take pictures of your heart. During this time, you will be asked to lie still and hold your breath for 2-3 seconds while a picture of your heart is being taken. The procedure may vary among health care providers and hospitals. What happens after the procedure? You can get dressed. You can return to your normal activities. It is up to you to get the results of your test. Ask your health care provider, or the department that is doing the test, when your results will be ready. Summary A coronary calcium scan is an imaging test used to look for deposits of calcium and other fatty materials (plaques) in the inner lining of the blood vessels of the heart (coronary arteries). Generally, this is a safe procedure. Tell your health care provider if you are pregnant or may be pregnant. No preparation is needed for this procedure. A CT scanner will take pictures of your heart. You can return to your normal activities after the scan  is done. This information is not intended to replace advice given to you by your health care provider. Make sure you discuss any questions you have with your health care provider. Document Released: 12/04/2007 Document Revised: 04/26/2016 Document Reviewed: 04/26/2016 Elsevier Interactive Patient Education  2017 [employer].    Follow-Up: At [facility], you and your health needs are our priority.  As part of our continuing mission to provide you with exceptional heart care, we have created designated Provider Care Teams.  These Care Teams include your primary Cardiologist (physician) and Advanced Practice Providers (APPs -  Physician Assistants and Nurse Practitioners) who all work together to provide you with the care you need, when you need it.  We recommend signing up for the patient portal called "MyChart".  Sign up information is provided on this After Visit Summary.  MyChart is used to connect with patients for Virtual Visits (Telemedicine).  Patients are able to view lab/test results, encounter notes, upcoming appointments, etc.  Non-urgent messages can be sent to your provider as well.   To learn more about what you can do with MyChart, go to [email or web address].    Your next appointment:   3-4 months with Dr. Ranae Plumber [facility] Phone: [phone] Test: Dyslipidemia/ASCVD panel  CPT codes: 01093, 81405, [phone].

## 2021-07-14 ENCOUNTER — Encounter: Payer: Self-pay | Admitting: Internal Medicine

## 2021-07-14 NOTE — Addendum Note (Signed)
Addended by: Lindell Spar on: 07/14/2021 10:40 AM   Modules accepted: Orders

## 2021-07-15 ENCOUNTER — Encounter: Payer: Self-pay | Admitting: Internal Medicine

## 2021-07-15 DIAGNOSIS — Z79899 Other long term (current) drug therapy: Secondary | ICD-10-CM

## 2021-07-15 DIAGNOSIS — E7800 Pure hypercholesterolemia, unspecified: Secondary | ICD-10-CM

## 2021-07-23 ENCOUNTER — Other Ambulatory Visit: Payer: Self-pay

## 2021-07-23 ENCOUNTER — Ambulatory Visit (HOSPITAL_BASED_OUTPATIENT_CLINIC_OR_DEPARTMENT_OTHER)
Admission: RE | Admit: 2021-07-23 | Discharge: 2021-07-23 | Disposition: A | Discharge status: Home / Self Care | Payer: Managed Care, Other (non HMO) | Source: Ambulatory Visit | Attending: Internal Medicine | Admitting: Internal Medicine

## 2021-07-23 ENCOUNTER — Encounter (HOSPITAL_BASED_OUTPATIENT_CLINIC_OR_DEPARTMENT_OTHER): Payer: Self-pay

## 2021-07-23 DIAGNOSIS — E7800 Pure hypercholesterolemia, unspecified: Secondary | ICD-10-CM | POA: Insufficient documentation

## 2021-08-04 ENCOUNTER — Ambulatory Visit (HOSPITAL_BASED_OUTPATIENT_CLINIC_OR_DEPARTMENT_OTHER): Payer: Managed Care, Other (non HMO) | Admitting: Internal Medicine

## 2021-08-14 ENCOUNTER — Other Ambulatory Visit: Payer: Self-pay | Admitting: Internal Medicine

## 2021-08-14 DIAGNOSIS — R911 Solitary pulmonary nodule: Secondary | ICD-10-CM

## 2021-08-20 ENCOUNTER — Encounter: Payer: Self-pay | Admitting: Internal Medicine

## 2021-08-22 ENCOUNTER — Other Ambulatory Visit (HOSPITAL_BASED_OUTPATIENT_CLINIC_OR_DEPARTMENT_OTHER): Payer: Managed Care, Other (non HMO)

## 2021-08-24 ENCOUNTER — Ambulatory Visit (HOSPITAL_BASED_OUTPATIENT_CLINIC_OR_DEPARTMENT_OTHER): Payer: Managed Care, Other (non HMO)

## 2021-11-05 LAB — LIPOPROTEIN A (LPA): Lipoprotein (a): 229.5 nmol/L — ABNORMAL HIGH (ref <75.0)

## 2021-11-05 LAB — NMR, LIPOPROFILE
Cholesterol, Total: 252 mg/dL — ABNORMAL HIGH (ref 100–199)
HDL Particle Number: 38.3 umol/L (ref >=30.5)
HDL-C: 96 mg/dL (ref >39)
LDL Particle Number: 1274 nmol/L — ABNORMAL HIGH (ref <1000)
LDL Size: 21.9 nm (ref >20.5)
LDL-C (NIH Calc): 145 mg/dL — ABNORMAL HIGH (ref 0–99)
LP-IR Score: <25 (ref <=45)
Small LDL Particle Number: <90 nmol/L (ref <=527)
Triglycerides: 68 mg/dL (ref 0–149)

## 2021-11-10 ENCOUNTER — Encounter: Payer: Self-pay | Admitting: Internal Medicine

## 2021-11-10 ENCOUNTER — Ambulatory Visit: Payer: Managed Care, Other (non HMO) | Admitting: Internal Medicine

## 2021-11-10 VITALS — BP 128/80 | HR 61 | Ht 66.0 in | Wt 168.6 lb

## 2021-11-10 DIAGNOSIS — E7841 Elevated Lipoprotein(a): Secondary | ICD-10-CM | POA: Diagnosis not present

## 2021-11-10 DIAGNOSIS — I2510 Atherosclerotic heart disease of native coronary artery without angina pectoris: Secondary | ICD-10-CM | POA: Diagnosis not present

## 2021-11-10 DIAGNOSIS — I2584 Coronary atherosclerosis due to calcified coronary lesion: Secondary | ICD-10-CM | POA: Diagnosis not present

## 2021-11-10 DIAGNOSIS — E7801 Familial hypercholesterolemia: Secondary | ICD-10-CM

## 2021-11-10 NOTE — Patient Instructions (Signed)
Medication Instructions:  Dr. Rennis Golden recommends Repatha Sureclick OR Praluent (PCSK9). This is an injectable cholesterol medication self-administered once every 14 days. This medication will likely need prior approval with your insurance company, which we will work on. If the medication is not approved initially, we may need to do an appeal with your insurance.   Administer medication in area of fatty tissue such as abdomen, outer thigh, back of upper arm - and rotate site with each injection Store medication in refrigerator until ready to administer - allow to sit at room temp for 30 mins - 1 hour prior to injection Dispose of medication in a SHARPS container - your pharmacy should be able to direct you on this and proper disposal   If you need a co-pay card for Repatha: [email or web address] >> paying for Repatha or red box that says "Repatha Copay Card" in top right If you need a co-pay card for Praluent: [email or web address]  Patient Assistance:    These foundations have funds at various times.   The [employer] Foundation: https://www.panfoundation.org/disease-funds/hypercholesterolemia/ -- can sign up for wait list  [facility] offers assistance to help pay for medication copays.  They will cover copays for all cholesterol lowering meds, including statins, fibrates, omega-3 fish oils like Vascepa, ezetimibe, Repatha, Praluent, Nexletol, Nexlizet.  The cards are usually good for $2,500 or 12 months, whichever comes first. Go to healthwellfoundation.org Click on "Apply Now" Answer questions as to whom is applying (patient or representative) Your disease fund will be "hypercholesterolemia - Medicare access" They will ask questions about finances and which medications you are taking for cholesterol When you submit, the approval is usually within minutes.  You will need to print the card information from the site You will need to show this information to your pharmacy, they  will bill your Medicare Part D plan first -then bill Health Well --for the copay.   You can also call them at [phone], although the hold times can be quite long.     *If you need a refill on your cardiac medications before your next appointment, please call your pharmacy*   Lab Work: Fasting NMR lipoprofile in 3-4 months  -- complete before next appointment  If you have labs (blood work) drawn today and your tests are completely normal, you will receive your results only by: MyChart Message (if you have MyChart) OR A paper copy in the mail If you have any lab test that is abnormal or we need to change your treatment, we will call you to review the results.   Testing/Procedures: Please contact office if you wish to have genetic testing CPT codes for the Dyslipidemia and ASCVD panel are as follows: 81401, 81405, 81406.   Follow-Up: At [facility], you and your health needs are our priority.  As part of our continuing mission to provide you with exceptional heart care, we have created designated Provider Care Teams.  These Care Teams include your primary Cardiologist (physician) and Advanced Practice Providers (APPs -  Physician Assistants and Nurse Practitioners) who all work together to provide you with the care you need, when you need it.  We recommend signing up for the patient portal called "MyChart".  Sign up information is provided on this After Visit Summary.  MyChart is used to connect with patients for Virtual Visits (Telemedicine).  Patients are able to view lab/test results, encounter notes, upcoming appointments, etc.  Non-urgent messages can be sent to your provider as well.   To learn more about what  you can do with MyChart, go to [email or web address].    Your next appointment:   3-4 months with Dr. Rennis Golden  - [facility] or [facility]

## 2021-11-10 NOTE — Progress Notes (Signed)
LIPID CLINIC CONSULT NOTE  Chief Complaint:  Follow-up dyslipidemia  Primary Care Physician: Olive Bass, FNP  Primary Cardiologist:  None  HPI:  Jennifer Hughes is a 56 y.o. female who is being seen today for the evaluation of anemia at the request of Olive Bass,*.  Is a pleasant 56 year old female kindly referred for evaluation and management of dyslipidemia.  She was initially noted to have high cholesterol number of years ago and referred for further evaluation.  She saw Dr. Alwyn Ren, local primary care provider who ordered an NMR LipoProfile.  This demonstrated an LDL particle number of 885, LDL-C of 111, HDL of 90 and triglycerides 60.  Total cholesterol 213.  At the time it was felt that her ratio was cardioprotective and no additional therapy was recommended.  She is currently and has been on no medication to treat her lipids, in fact she is on no medications at all.  She says she is feels that she is fairly healthy.  She is active and exercises regularly.  She is maintaining a normal body weight and has no history of hypertension or prediabetes, etc.  Recently she had repeat lipids, however which were concerning showing total cholesterol 343, triglycerides 55, HDL 98 and LDL 233.  Based on this finding, she was referred for further evaluation.  She does report history of heart disease in her family including her father who had MI in his 76s and mother who had a stroke in probably her 17s.  I believe she had high cholesterol.  11/10/2021  Jennifer Hughes returns today for follow-up of dyslipidemia.  So far she has done well on rosuvastatin.  She seems to tolerate it.  Initially she had some possible side effects however they seem to be improving and that she stuck with the medicine.  She did have a calcium score performed which was abnormal.  All the calcium was noted in the LAD but measured 7.32, 76 percentile for age and sex matched controls.  She did have repeat labs  which show a reduction in LDL particle number now down to 1274, LDL-C 145, HDL-C 96 and triglycerides 68.  Small LDL particle number is undetectable.  Although there has been improvement in cholesterol, the reduction is not quite the 50% that we expected from LDL of 233.  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  We discussed this in depth today.  Because she still remains above target 50% reduction and goal LDL less than 70 for familial hyperlipidemia, she will likely need additional therapy.  PMHx:  Past Medical History:  Diagnosis Date   Allergy    hayfever    Chickenpox    Hepatitis    Hyperlipidemia     Past Surgical History:  Procedure Laterality Date   BREAST BIOPSY     TONSILLECTOMY AND ADENOIDECTOMY  1973    FAMHx:  Family History  Problem Relation Age of Onset   Hyperlipidemia Mother    Cancer Mother        gynecologic   Heart attack Father        in 40s   Diabetes Maternal Grandmother    Stroke Neg Hx     SOCHx:   reports that she quit smoking about 23 years ago. Her smoking use included cigarettes. She has never used smokeless tobacco. She reports current alcohol use of about 7.0 standard drinks per week. She reports that she does not use drugs.  ALLERGIES:  Allergies  Allergen Reactions   Codeine Nausea Only    ROS: Pertinent items noted in HPI and remainder of comprehensive ROS otherwise negative.  HOME MEDS: Current Outpatient Medications on File Prior to Visit  Medication Sig Dispense Refill   Multiple Vitamins-Minerals (MULTIPLE VITAMINS/WOMENS PO) Take 1 capsule by mouth daily.     rosuvastatin (CRESTOR) 20 MG tablet Take 1 tablet (20 mg total) by mouth daily. 90 tablet 3   Specialty Vitamins Products (MM BIOTIN/KERATIN PO) Take by mouth.     valACYclovir (VALTREX) 500 MG tablet Take 500 mg by mouth daily.     No current facility-administered medications on file prior to visit.    LABS/IMAGING: No results  found for this or any previous visit (from the past 48 hour(s)). No results found.  LIPID PANEL:    Component Value Date/Time   CHOL 343 (H) 02/20/2021 1019   CHOL 213 (H) 09/14/2013 0823   TRIG 55.0 02/20/2021 1019   TRIG 60 09/14/2013 0823   HDL 98.70 02/20/2021 1019   HDL 90 09/14/2013 0823   CHOLHDL 3 02/20/2021 1019   VLDL 11.0 02/20/2021 1019   LDLCALC 233 (H) 02/20/2021 1019   LDLCALC 111 (H) 09/14/2013 0823    WEIGHTS: Wt Readings from Last 3 Encounters:  11/10/21 168 lb 9.6 oz (76.5 kg)  07/13/21 161 lb (73 kg)  02/19/21 168 lb 9.6 oz (76.5 kg)    VITALS: BP 128/80   Pulse 61   Ht 5\' 6"  (1.676 m)   Wt 168 lb 9.6 oz (76.5 kg)   SpO2 99%   BMI 27.21 kg/m   EXAM: Deferred  EKG: Deferred  ASSESSMENT: Probable familial hyperlipidemia, 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   Jennifer Hughes has had less than expected reduction on high intensity rosuvastatin and cholesterol remains well above target at 145 LDL with a goal less than 70 and 50% reduction as per 2018 comprehensive lipid-lowering guidelines.  In addition she was found to have age advanced coronary calcification and a very high LP(a).  This likely explains the less than expected response to her statin.  Based on this she will need additional therapy and she is a good candidate for PCSK9 inhibitor.  Would pursue Repatha 140 mg every 2 weeks.  We will plan to repeat lipid NMR in about 3 months to demonstrate maximal benefit of the medication.  Unfortunately although her LP(a) is high, she would not qualify for clinical trials at this time due to lack of prior history of MI or stroke.  We did discuss the the possibility of genetic testing which she will consider and she wishes to reach out to her insurance  company first to see if this would be covered.  Plan follow-up with me afterwards.  2019, MD, [facility], FACP  [place]  [facility]  Medical Director of the Advanced Lipid Disorders &  Cardiovascular Risk Reduction Clinic Diplomate of the American Board of Clinical Lipidology Attending Cardiologist  Direct Dial: [phone]  Fax: [phone]  Website:  [email or web address] Jennifer Hughes 11/10/2021, 10:16 AM

## 2021-11-24 ENCOUNTER — Encounter (HOSPITAL_BASED_OUTPATIENT_CLINIC_OR_DEPARTMENT_OTHER): Payer: Self-pay | Admitting: Internal Medicine

## 2021-11-24 ENCOUNTER — Telehealth: Payer: Self-pay | Admitting: Internal Medicine

## 2021-11-24 NOTE — Telephone Encounter (Signed)
PA for Repatha submitted via CMM (Key: [id number])

## 2021-12-04 MED ORDER — REPATHA SURECLICK 140 MG/ML ~~LOC~~ SOAJ: 1.0000 | SUBCUTANEOUS | 3 refills | Status: DC

## 2021-12-04 NOTE — Addendum Note (Signed)
Addended by: Lindell Spar on: 12/04/2021 09:24 AM   Modules accepted: Orders

## 2021-12-04 NOTE — Telephone Encounter (Signed)
CCEQFD:[id number];QNVVYX:[id number];Review Type:Prior Auth;Coverage Start Date:11/24/2021;Coverage End Date:12/04/2022;

## 2022-02-05 LAB — HEPATIC FUNCTION PANEL
ALT: 29 IU/L (ref 0–32)
AST: 33 IU/L (ref 0–40)
Albumin: 4.9 g/dL (ref 3.8–4.9)
Alkaline Phosphatase: 107 IU/L (ref 44–121)
Bilirubin Total: 0.8 mg/dL (ref 0.0–1.2)
Bilirubin, Direct: 0.25 mg/dL (ref 0.00–0.40)
Total Protein: 7.5 g/dL (ref 6.0–8.5)

## 2022-02-06 LAB — NMR, LIPOPROFILE
Cholesterol, Total: 205 mg/dL — ABNORMAL HIGH (ref 100–199)
HDL Particle Number: 47.2 umol/L (ref >=30.5)
HDL-C: 111 mg/dL (ref >39)
LDL Particle Number: 779 nmol/L (ref <1000)
LDL Size: 21.5 nm (ref >20.5)
LDL-C (NIH Calc): 83 mg/dL (ref 0–99)
LP-IR Score: <25 (ref <=45)
Small LDL Particle Number: <90 nmol/L (ref <=527)
Triglycerides: 63 mg/dL (ref 0–149)

## 2022-02-16 ENCOUNTER — Ambulatory Visit (HOSPITAL_BASED_OUTPATIENT_CLINIC_OR_DEPARTMENT_OTHER): Payer: Managed Care, Other (non HMO) | Admitting: Internal Medicine

## 2022-02-16 ENCOUNTER — Encounter (HOSPITAL_BASED_OUTPATIENT_CLINIC_OR_DEPARTMENT_OTHER): Payer: Self-pay | Admitting: Internal Medicine

## 2022-02-16 VITALS — BP 118/64 | HR 63 | Ht 66.0 in | Wt 166.6 lb

## 2022-02-16 DIAGNOSIS — I2510 Atherosclerotic heart disease of native coronary artery without angina pectoris: Secondary | ICD-10-CM

## 2022-02-16 DIAGNOSIS — E7801 Familial hypercholesterolemia: Secondary | ICD-10-CM | POA: Diagnosis not present

## 2022-02-16 DIAGNOSIS — E7841 Elevated Lipoprotein(a): Secondary | ICD-10-CM | POA: Diagnosis not present

## 2022-02-16 DIAGNOSIS — I2584 Coronary atherosclerosis due to calcified coronary lesion: Secondary | ICD-10-CM

## 2022-02-16 NOTE — Progress Notes (Signed)
LIPID CLINIC CONSULT NOTE  Chief Complaint:  Follow-up dyslipidemia  Primary Care Physician: Olive Bass, FNP  Primary Cardiologist:  None  HPI:  Shenoa Hattabaugh is a 56 y.o. female who is being seen today for the evaluation of anemia at the request of Olive Bass,*.  Is a pleasant 56 year old female kindly referred for evaluation and management of dyslipidemia.  She was initially noted to have high cholesterol number of years ago and referred for further evaluation.  She saw Dr. Alwyn Ren, local primary care provider who ordered an NMR LipoProfile.  This demonstrated an LDL particle number of 885, LDL-C of 111, HDL of 90 and triglycerides 60.  Total cholesterol 213.  At the time it was felt that her ratio was cardioprotective and no additional therapy was recommended.  She is currently and has been on no medication to treat her lipids, in fact she is on no medications at all.  She says she is feels that she is fairly healthy.  She is active and exercises regularly.  She is maintaining a normal body weight and has no history of hypertension or prediabetes, etc.  Recently she had repeat lipids, however which were concerning showing total cholesterol 343, triglycerides 55, HDL 98 and LDL 233.  Based on this finding, she was referred for further evaluation.  She does report history of heart disease in her family including her father who had MI in his 45s and mother who had a stroke in probably her 50s.  I believe she had high cholesterol.  11/10/2021  Mrs. Spindle returns today for follow-up of dyslipidemia.  So far she has done well on rosuvastatin.  She seems to tolerate it.  Initially she had some possible side effects however they seem to be improving and that she stuck with the medicine.  She did have a calcium score performed which was abnormal.  All the calcium was noted in the LAD but measured 7.32, 76 percentile for age and sex matched controls.  She did have repeat labs  which show a reduction in LDL particle number now down to 1274, LDL-C 145, HDL-C 96 and triglycerides 68.  Small LDL particle number is undetectable.  Although there has been improvement in cholesterol, the reduction is not quite the 50% that we expected from LDL of 233.  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  We discussed this in depth today.  Because she still remains above target 50% reduction and goal LDL less than 70 for familial hyperlipidemia, she will likely need additional therapy.  02/16/2022  Mrs. Tirpak returns today for follow-up.  She is doing well with the addition of Repatha to her rosuvastatin.  She has had a significant incremental reduction in her cholesterol.  LDL particle number now 779 (reduced from 1274), LDL-C of 83 (down from 145), HDL-C of 111, up from 96 and triglycerides of 63.  Small LDL particle numbers are undetectable.  Overall very favorable particle test even though her LDL-C is not less than 70, her particle numbers are small indicating that they are large particles, notably the LDL size is 21.5 Angstrom.  PMHx:  Past Medical History:  Diagnosis Date   Allergy    hayfever    Chickenpox    Hepatitis    Hyperlipidemia     Past Surgical History:  Procedure Laterality Date   BREAST BIOPSY     TONSILLECTOMY AND ADENOIDECTOMY  1973    FAMHx:  Family  History  Problem Relation Age of Onset   Hyperlipidemia Mother    Cancer Mother        gynecologic   Heart attack Father        in 57s   Diabetes Maternal Grandmother    Stroke Neg Hx     SOCHx:   reports that she quit smoking about 23 years ago. Her smoking use included cigarettes. She has never used smokeless tobacco. She reports current alcohol use of about 7.0 standard drinks of alcohol per week. She reports that she does not use drugs.  ALLERGIES:  Allergies  Allergen Reactions   Codeine Nausea Only    ROS: Pertinent items noted in HPI and remainder of  comprehensive ROS otherwise negative.  HOME MEDS: Current Outpatient Medications on File Prior to Visit  Medication Sig Dispense Refill   Evolocumab (REPATHA SURECLICK) 140 MG/ML SOAJ Inject 1 Dose into the skin every 14 (fourteen) days. 6 mL 3   Multiple Vitamins-Minerals (MULTIPLE VITAMINS/WOMENS PO) Take 1 capsule by mouth daily.     Specialty Vitamins Products (MM BIOTIN/KERATIN PO) Take by mouth.     valACYclovir (VALTREX) 500 MG tablet Take 500 mg by mouth daily.     rosuvastatin (CRESTOR) 20 MG tablet Take 1 tablet (20 mg total) by mouth daily. 90 tablet 3   No current facility-administered medications on file prior to visit.    LABS/IMAGING: No results found for this or any previous visit (from the past 48 hour(s)). No results found.  LIPID PANEL:    Component Value Date/Time   CHOL 343 (H) 02/20/2021 1019   CHOL 213 (H) 09/14/2013 0823   TRIG 55.0 02/20/2021 1019   TRIG 60 09/14/2013 0823   HDL 98.70 02/20/2021 1019   HDL 90 09/14/2013 0823   CHOLHDL 3 02/20/2021 1019   VLDL 11.0 02/20/2021 1019   LDLCALC 233 (H) 02/20/2021 1019   LDLCALC 111 (H) 09/14/2013 0823    WEIGHTS: Wt Readings from Last 3 Encounters:  02/16/22 166 lb 9.6 oz (75.6 kg)  11/10/21 168 lb 9.6 oz (76.5 kg)  07/13/21 161 lb (73 kg)    VITALS: BP 118/64   Pulse 63   Ht 5\' 6"  (1.676 m)   Wt 166 lb 9.6 oz (75.6 kg)   BMI 26.89 kg/m   EXAM: Deferred  EKG: Deferred  ASSESSMENT: Probable familial hyperlipidemia, 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   Ms. Shull has had a favorable response to Repatha in addition to her rosuvastatin.  She reported a few mild side effects however she says they are improving.  She also reported some dental pain after the injections which could  indicate inflammation or possible microabscess.  I encouraged her to follow-up with her dentist about this.  We will plan repeat lipid NMR in 6 months.  We could consider ezetimibe at some point if we need additional lipid lowering however her cholesterol looks pretty good at this point.  Follow-up with me then.  Rockne Menghini, MD, [facility], FACP  [place]  [facility]  Medical Director of the Advanced Lipid Disorders &  Cardiovascular Risk Reduction Clinic Diplomate of the American Board of Clinical Lipidology Attending Cardiologist  Direct Dial: [phone]  Fax: [phone]  Website:  [email or web address]  [phone] Marselino Slayton 02/16/2022, 8:15 AM

## 2022-02-16 NOTE — Patient Instructions (Signed)
Medication Instructions:  NO CHANGES  *If you need a refill on your cardiac medications before your next appointment, please call your pharmacy*   Lab Work: FASTING lab work in 6 months  If you have labs (blood work) drawn today and your tests are completely normal, you will receive your results only by: [employer] (if you have MyChart) OR A paper copy in the mail If you have any lab test that is abnormal or we need to change your treatment, we will call you to review the results.   Follow-Up: At [facility], you and your health needs are our priority.  As part of our continuing mission to provide you with exceptional heart care, we have created designated Provider Care Teams.  These Care Teams include your primary Cardiologist (physician) and Advanced Practice Providers (APPs -  Physician Assistants and Nurse Practitioners) who all work together to provide you with the care you need, when you need it.  We recommend signing up for the patient portal called "MyChart".  Sign up information is provided on this After Visit Summary.  MyChart is used to connect with patients for Virtual Visits (Telemedicine).  Patients are able to view lab/test results, encounter notes, upcoming appointments, etc.  Non-urgent messages can be sent to your provider as well.   To learn more about what you can do with MyChart, go to [email or web address].    Your next appointment:   6 months with Dr. Rennis Golden -- lipid clinic

## 2022-04-08 ENCOUNTER — Encounter (HOSPITAL_BASED_OUTPATIENT_CLINIC_OR_DEPARTMENT_OTHER): Payer: Self-pay | Admitting: Internal Medicine

## 2022-05-19 ENCOUNTER — Encounter (HOSPITAL_BASED_OUTPATIENT_CLINIC_OR_DEPARTMENT_OTHER): Payer: Self-pay | Admitting: Internal Medicine

## 2022-05-21 LAB — HM PAP SMEAR: HM Pap smear: NORMAL

## 2022-05-24 LAB — HM MAMMOGRAPHY

## 2022-06-19 ENCOUNTER — Other Ambulatory Visit (HOSPITAL_BASED_OUTPATIENT_CLINIC_OR_DEPARTMENT_OTHER): Payer: Self-pay | Admitting: Internal Medicine

## 2022-06-19 DIAGNOSIS — E7800 Pure hypercholesterolemia, unspecified: Secondary | ICD-10-CM

## 2022-07-06 ENCOUNTER — Encounter (HOSPITAL_BASED_OUTPATIENT_CLINIC_OR_DEPARTMENT_OTHER): Payer: Self-pay | Admitting: Internal Medicine

## 2022-07-12 ENCOUNTER — Other Ambulatory Visit: Payer: Self-pay | Admitting: Pharmacist

## 2022-07-12 MED ORDER — REPATHA SURECLICK 140 MG/ML ~~LOC~~ SOAJ
1.0000 | SUBCUTANEOUS | 3 refills | Status: DC
Start: 2022-07-12 — End: 2022-07-29

## 2022-07-12 NOTE — Progress Notes (Signed)
Received fax that pt is required to fill Repatha at [place], refill sent in.

## 2022-07-29 ENCOUNTER — Other Ambulatory Visit: Payer: Self-pay | Admitting: Pharmacist

## 2022-07-29 MED ORDER — REPATHA SURECLICK 140 MG/ML ~~LOC~~ SOAJ: 1.0000 | SUBCUTANEOUS | 3 refills | Status: DC

## 2022-07-30 ENCOUNTER — Ambulatory Visit (HOSPITAL_BASED_OUTPATIENT_CLINIC_OR_DEPARTMENT_OTHER): Payer: No Typology Code available for payment source

## 2022-08-03 ENCOUNTER — Encounter (HOSPITAL_BASED_OUTPATIENT_CLINIC_OR_DEPARTMENT_OTHER): Payer: Self-pay | Admitting: Internal Medicine

## 2022-08-03 NOTE — Telephone Encounter (Signed)
CT test order/printed Left for pick up at [facility] office

## 2022-08-10 ENCOUNTER — Encounter (HOSPITAL_BASED_OUTPATIENT_CLINIC_OR_DEPARTMENT_OTHER): Payer: Self-pay | Admitting: Internal Medicine

## 2022-08-12 LAB — NMR, LIPOPROFILE
Cholesterol, Total: 200 mg/dL — ABNORMAL HIGH (ref 100–199)
HDL Particle Number: 46.3 umol/L (ref >=30.5)
HDL-C: 106 mg/dL (ref >39)
LDL Particle Number: 636 nmol/L (ref <1000)
LDL Size: 21.1 nm (ref >20.5)
LDL-C (NIH Calc): 84 mg/dL (ref 0–99)
LP-IR Score: <25 (ref <=45)
Small LDL Particle Number: <90 nmol/L (ref <=527)
Triglycerides: 50 mg/dL (ref 0–149)

## 2022-08-12 LAB — LIPOPROTEIN A (LPA): Lipoprotein (a): 211.4 nmol/L — ABNORMAL HIGH (ref <75.0)

## 2022-08-17 ENCOUNTER — Ambulatory Visit (INDEPENDENT_AMBULATORY_CARE_PROVIDER_SITE_OTHER): Payer: No Typology Code available for payment source | Admitting: Internal Medicine

## 2022-08-17 VITALS — BP 129/81 | HR 60 | Ht 66.0 in | Wt 172.6 lb

## 2022-08-17 DIAGNOSIS — I2584 Coronary atherosclerosis due to calcified coronary lesion: Secondary | ICD-10-CM

## 2022-08-17 DIAGNOSIS — R911 Solitary pulmonary nodule: Secondary | ICD-10-CM

## 2022-08-17 DIAGNOSIS — I2510 Atherosclerotic heart disease of native coronary artery without angina pectoris: Secondary | ICD-10-CM

## 2022-08-17 DIAGNOSIS — E7841 Elevated Lipoprotein(a): Secondary | ICD-10-CM

## 2022-08-17 DIAGNOSIS — E7801 Familial hypercholesterolemia: Secondary | ICD-10-CM

## 2022-08-17 NOTE — Progress Notes (Signed)
LIPID CLINIC CONSULT NOTE  Chief Complaint:  Follow-up dyslipidemia  Primary Care Physician: Marrian Salvage, [place]  Primary Cardiologist:  None  HPI:  [name] is a 57 y.o. female who is being seen today for the evaluation of anemia at the request of Marrian Salvage,*.  Is a pleasant 57 year old female kindly referred for evaluation and management of dyslipidemia.  She was initially noted to have high cholesterol number of years ago and referred for further evaluation.  She saw Dr. Linna Darner, local primary care provider who ordered an NMR LipoProfile.  This demonstrated an LDL particle number of 885, LDL-C of 111, HDL of 90 and triglycerides 60.  Total cholesterol 213.  At the time it was felt that her ratio was cardioprotective and no additional therapy was recommended.  She is currently and has been on no medication to treat her lipids, in fact she is on no medications at all.  She says she is feels that she is fairly healthy.  She is active and exercises regularly.  She is maintaining a normal body weight and has no history of hypertension or prediabetes, etc.  Recently she had repeat lipids, however which were concerning showing total cholesterol 343, triglycerides 55, HDL 98 and LDL 233.  Based on this finding, she was referred for further evaluation.  She does report history of heart disease in her family including her father who had MI in his 59s and mother who had a stroke in probably her 36s.  I believe she had high cholesterol.  11/10/2021  [name] returns today for follow-up of dyslipidemia.  So far she has done well on rosuvastatin.  She seems to tolerate it.  Initially she had some possible side effects however they seem to be improving and that she stuck with the medicine.  She did have a calcium score performed which was abnormal.  All the calcium was noted in the LAD but measured 7.32, 76 percentile for age and sex matched controls.  She did have repeat labs  which show a reduction in LDL particle number now down to 1274, LDL-C 145, HDL-C 96 and triglycerides 68.  Small LDL particle number is undetectable.  Although there has been improvement in cholesterol, the reduction is not quite the 50% that we expected from LDL of 233.  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  We discussed this in depth today.  Because she still remains above target 50% reduction and goal LDL less than 70 for familial hyperlipidemia, she will likely need additional therapy.  02/16/2022  Mrs. Hogenson returns today for follow-up.  She is doing well with the addition of Repatha to her rosuvastatin.  She has had a significant incremental reduction in her cholesterol.  LDL particle number now 779 (reduced from 1274), LDL-C of 83 (down from 145), HDL-C of 111, up from 96 and triglycerides of 63.  Small LDL particle numbers are undetectable.  Overall very favorable particle test even though her LDL-C is not less than 70, her particle numbers are small indicating that they are large particles, notably the LDL size is 21.5 Angstrom.  08/17/2022  Jennifer Hughes is seen today in follow-up.  Her lipids have remained fairly stable if not somewhat better.  Her LDL particle number is now 636 (down from 779), LDL-C is stable at 84, HDL-C is 106, and small LDL particle number remains less than 90.  She has had a small decrease in LP(a)  from 229.5 to 211.4 nmol/L.  Her coronary calcium CT scan last year did show some small pulmonary nodules and a 1 year follow-up was recommended.  She has not scheduled that yet due to insurance issues.  PMHx:  Past Medical History:  Diagnosis Date   Allergy    hayfever    Chickenpox    Hepatitis    Hyperlipidemia     Past Surgical History:  Procedure Laterality Date   BREAST BIOPSY     TONSILLECTOMY AND ADENOIDECTOMY  1973    FAMHx:  Family History  Problem Relation Age of Onset   Hyperlipidemia Mother    Cancer Mother         gynecologic   Heart attack Father        in 50s   Diabetes Maternal Grandmother    Stroke Neg Hx     SOCHx:   reports that she quit smoking about 24 years ago. Her smoking use included cigarettes. She has never used smokeless tobacco. She reports current alcohol use of about 7.0 standard drinks of alcohol per week. She reports that she does not use drugs.  ALLERGIES:  Allergies  Allergen Reactions   Codeine Nausea Only    ROS: Pertinent items noted in HPI and remainder of comprehensive ROS otherwise negative.  HOME MEDS: Current Outpatient Medications on File Prior to Visit  Medication Sig Dispense Refill   Evolocumab (REPATHA SURECLICK) [id number] MG/ML SOAJ Inject 140 mg into the skin every 14 (fourteen) days. 6 mL 3   Multiple Vitamins-Minerals (MULTIPLE VITAMINS/WOMENS PO) Take 1 capsule by mouth daily.     rosuvastatin (CRESTOR) 20 MG tablet TAKE 1 TABLET(20 MG) BY MOUTH DAILY 90 tablet 3   Specialty Vitamins Products (MM BIOTIN/KERATIN PO) Take by mouth.     valACYclovir (VALTREX) 500 MG tablet Take 500 mg by mouth daily.     No current facility-administered medications on file prior to visit.    LABS/IMAGING: No results found for this or any previous visit (from the past 48 hour(s)). No results found.  LIPID PANEL:    Component Value Date/Time   CHOL 343 (H) 02/20/2021 1019   CHOL 213 (H) 09/14/2013 0823   TRIG 55.0 02/20/2021 1019   TRIG 60 09/14/2013 0823   HDL 98.70 02/20/2021 1019   HDL 90 09/14/2013 0823   CHOLHDL 3 02/20/2021 1019   VLDL 11.0 02/20/2021 1019   LDLCALC 233 (H) 02/20/2021 1019   LDLCALC 111 (H) 09/14/2013 0823    WEIGHTS: Wt Readings from Last 3 Encounters:  08/17/22 172 lb 9.6 oz (78.3 kg)  02/16/22 166 lb 9.6 oz (75.6 kg)  11/10/21 168 lb 9.6 oz (76.5 kg)    VITALS: BP 129/81 (BP Location: Left Arm, Patient Position: Sitting, Cuff Size: Normal)   Pulse 60   Ht '5\' 6"'$  (1.676 m)   Wt 172 lb 9.6 oz (78.3 kg)   SpO2 97%   BMI  27.86 kg/m   EXAM: Deferred  EKG: Deferred  ASSESSMENT: Probable familial hyperlipidemia, based on Simon Broome criteria, LDL greater than 220 untreated Family history of coronary disease, not premature onset in mother and father 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   Ms. Wery continues to have excellent improvement in her lipids.  Her CT showed a tiny nodular density in the right lung base.  CT follow-up is recommended.  I provided an order for that today so that she can  determine what her insurance would pay for.  Fortunately, her Repatha costs are nothing.  Plan follow-up with me annually or sooner as necessary.  Pixie Casino, MD, [facility], [place] Director of the Advanced Lipid Disorders &  Cardiovascular Risk Reduction Clinic Diplomate of the American Board of Clinical Lipidology Attending Cardiologist  Direct Dial: [phone]  Fax: [phone]  Website:  [email or web address] 08/17/2022, 8:12 AM

## 2022-08-17 NOTE — Patient Instructions (Signed)
Medication Instructions:  NO CHANGES  *If you need a refill on your cardiac medications before your next appointment, please call your pharmacy*   Lab Work: FASTING lab work to check cholesterol in 1 year -- complete before next appointment  If you have labs (blood work) drawn today and your tests are completely normal, you will receive your results only by: Jennifer Hughes (if you have MyChart) OR A paper copy in the mail If you have any lab test that is abnormal or we need to change your treatment, we will call you to review the results.   Testing/Procedures: Chest CT   Follow-Up: At [facility], you and your health needs are our priority.  As part of our continuing mission to provide you with exceptional heart care, we have created designated Provider Care Teams.  These Care Teams include your primary Cardiologist (physician) and Advanced Practice Providers (APPs -  Physician Assistants and Nurse Practitioners) who all work together to provide you with the care you need, when you need it.  We recommend signing up for the patient portal called "MyChart".  Sign up information is provided on this After Visit Summary.  MyChart is used to connect with patients for Virtual Visits (Telemedicine).  Patients are able to view lab/test results, encounter notes, upcoming appointments, etc.  Non-urgent messages can be sent to your provider as well.   To learn more about what you can do with MyChart, go to [email or web address].    Your next appointment:    12 months with Dr. Debara Pickett

## 2022-08-23 ENCOUNTER — Telehealth: Payer: Self-pay | Admitting: Internal Medicine

## 2022-08-23 DIAGNOSIS — R911 Solitary pulmonary nodule: Secondary | ICD-10-CM

## 2022-08-23 NOTE — Telephone Encounter (Signed)
Was on hold 5 min will try again tomorrow

## 2022-08-23 NOTE — Telephone Encounter (Signed)
[employer], part of pt's health plan is requesting the CT order to be faxed to them at [phone].

## 2022-08-24 NOTE — Telephone Encounter (Signed)
Called [employer]. They need a printed/signed order faxed to them. They received one from patient but it was unclear. Will get a signature from MD on 08/25/22 and fax

## 2022-08-24 NOTE — Telephone Encounter (Signed)
[employer] called to follow up on getting the CT order fax to them at [phone]

## 2022-09-13 ENCOUNTER — Encounter (HOSPITAL_BASED_OUTPATIENT_CLINIC_OR_DEPARTMENT_OTHER): Payer: Self-pay | Admitting: Internal Medicine

## 2022-10-08 ENCOUNTER — Ambulatory Visit (INDEPENDENT_AMBULATORY_CARE_PROVIDER_SITE_OTHER): Payer: No Typology Code available for payment source | Admitting: Family

## 2022-10-08 ENCOUNTER — Encounter: Payer: Self-pay | Admitting: Family

## 2022-10-08 VITALS — BP 130/62 | HR 55 | Temp 98.1°F | Resp 16 | Ht 66.0 in | Wt 172.5 lb

## 2022-10-08 DIAGNOSIS — L989 Disorder of the skin and subcutaneous tissue, unspecified: Secondary | ICD-10-CM | POA: Diagnosis not present

## 2022-10-08 DIAGNOSIS — M545 Low back pain, unspecified: Secondary | ICD-10-CM

## 2022-10-08 DIAGNOSIS — M25522 Pain in left elbow: Secondary | ICD-10-CM

## 2022-10-08 DIAGNOSIS — Z0000 Encounter for general adult medical examination without abnormal findings: Secondary | ICD-10-CM | POA: Diagnosis not present

## 2022-10-08 DIAGNOSIS — R7309 Other abnormal glucose: Secondary | ICD-10-CM

## 2022-10-08 NOTE — Progress Notes (Signed)
Jennifer Hughes is a 57 y.o. female with the following history as recorded in EpicCare:  Patient Active Problem List   Diagnosis Date Noted   Other and unspecified hyperlipidemia 08/03/2013    Current Outpatient Medications  Medication Sig Dispense Refill   Evolocumab (REPATHA SURECLICK) 140 MG/ML SOAJ Inject 140 mg into the skin every 14 (fourteen) days. 6 mL 3   Multiple Vitamins-Minerals (MULTIPLE VITAMINS/WOMENS PO) Take 1 capsule by mouth daily.     rosuvastatin (CRESTOR) 20 MG tablet TAKE 1 TABLET(20 MG) BY MOUTH DAILY 90 tablet 3   Specialty Vitamins Products (MM BIOTIN/KERATIN PO) Take by mouth.     valACYclovir (VALTREX) 500 MG tablet Take 500 mg by mouth daily.     No current facility-administered medications for this visit.    Allergies: Codeine  Past Medical History:  Diagnosis Date   Allergy    hayfever    Chickenpox    Hepatitis    Hyperlipidemia     Past Surgical History:  Procedure Laterality Date   BREAST BIOPSY     TONSILLECTOMY AND ADENOIDECTOMY  1973    Family History  Problem Relation Age of Onset   Hyperlipidemia Mother    Cancer Mother        gynecologic   Heart attack Father        in 64s   Diabetes Maternal Grandmother    Stroke Neg Hx     Social History   Tobacco Use   Smoking status: Former    Types: Cigarettes    Quit date: 08/03/1998    Years since quitting: 24.1   Smokeless tobacco: Never   Tobacco comments:    smoked 1998-2000, up to 10 cigarettes / week  Substance Use Topics   Alcohol use: Yes    Alcohol/week: 7.0 standard drinks of alcohol    Types: 7 Glasses of wine per week    Comment: Wine 1 glass/day    Subjective:  Presents for yearly CPE; does see GYN regularly; working with lipid specialist also;  Colonoscopy was done at age 76- 10 year follow up was recommended ( due at age 15);  Exercises with trainer 3x per week;   Review of Systems  Constitutional: Negative.   HENT: Negative.    Eyes: Negative.   Respiratory:  Negative.    Cardiovascular: Negative.   Gastrointestinal: Negative.   Genitourinary: Negative.   Musculoskeletal: Negative.   Skin: Negative.   Neurological: Negative.   Endo/Heme/Allergies: Negative.   Psychiatric/Behavioral: Negative.        Objective:  Vitals:   10/08/22 1356  BP: 130/62  Pulse: (!) 55  Resp: 16  Temp: 98.1 F (36.7 C)  TempSrc: Oral  SpO2: 93%  Weight: 172 lb 8 oz (78.2 kg)  Height:  (1.676 m)    General: Well developed, well nourished, in no acute distress  Skin : Warm and dry.  Head: Normocephalic and atraumatic  Eyes: Sclera and conjunctiva clear; pupils round and reactive to light; extraocular movements intact  Ears: External normal; canals clear; tympanic membranes normal  Oropharynx: Pink, supple. No suspicious lesions  Neck: Supple without thyromegaly, adenopathy  Lungs: Respirations unlabored; clear to auscultation bilaterally without wheeze, rales, rhonchi  CVS exam: normal rate and regular rhythm.  Abdomen: Soft; nontender; nondistended; normoactive bowel sounds; no masses or hepatosplenomegaly  Musculoskeletal: No deformities; no active joint inflammation  Extremities: No edema, cyanosis, clubbing  Vessels: Symmetric bilaterally  Neurologic: Alert and oriented; speech intact; face symmetrical; moves all extremities well;  CNII-XII intact without focal deficit  Assessment:  1. PE (physical exam), annual   2. Left elbow pain   3. Low back pain, unspecified back pain laterality, unspecified chronicity, unspecified whether sciatica present   4. Changing skin lesion   5. Elevated glucose     Plan:  Age appropriate preventive healthcare needs addressed; encouraged regular eye doctor and dental exams; encouraged regular exercise; will update labs and refills as needed today; follow-up to be determined; Referral updated to dermatology and orthopedics; She will plan to get colonoscopy in 2025;  Follow up in 1 year, sooner prn.   No  follow-ups on file.  Orders Placed This Encounter  Procedures   CBC with Differential/Platelet   Comp Met (CMET)   Hemoglobin A1c   TSH   Ambulatory referral to Orthopedics    Referral Priority:   Routine    Referral Type:   Consultation    Number of Visits Requested:   1   Ambulatory referral to Dermatology    Referral Priority:   Routine    Referral Type:   Consultation    Referral Reason:   Specialty Services Required    Requested Specialty:   Dermatology    Number of Visits Requested:   1    Requested Prescriptions    No prescriptions requested or ordered in this encounter

## 2022-10-08 NOTE — Patient Instructions (Addendum)
Please get your Tdap and second Shingles at your pharmacy;   Please consider getting a yearly eye exam;

## 2022-10-09 LAB — COMPREHENSIVE METABOLIC PANEL
AG Ratio: 2.1 (calc) (ref 1.0–2.5)
ALT: 34 U/L — ABNORMAL HIGH (ref 6–29)
AST: 31 U/L (ref 10–35)
Albumin: 4.5 g/dL (ref 3.6–5.1)
Alkaline phosphatase (APISO): 96 U/L (ref 37–153)
BUN: 17 mg/dL (ref 7–25)
CO2: 24 mmol/L (ref 20–32)
Calcium: 9.7 mg/dL (ref 8.6–10.4)
Chloride: 105 mmol/L (ref 98–110)
Creat: 1.03 mg/dL (ref 0.50–1.03)
Globulin: 2.1 g/dL (calc) (ref 1.9–3.7)
Glucose, Bld: 81 mg/dL (ref 65–99)
Potassium: 4.3 mmol/L (ref 3.5–5.3)
Sodium: 141 mmol/L (ref 135–146)
Total Bilirubin: 0.5 mg/dL (ref 0.2–1.2)
Total Protein: 6.6 g/dL (ref 6.1–8.1)

## 2022-10-09 LAB — CBC WITH DIFFERENTIAL/PLATELET
Absolute Monocytes: 422 cells/uL (ref 200–950)
Basophils Absolute: 53 cells/uL (ref 0–200)
Basophils Relative: 0.8 %
Eosinophils Absolute: 238 cells/uL (ref 15–500)
Eosinophils Relative: 3.6 %
HCT: 37.7 % (ref 35.0–45.0)
Hemoglobin: 12.8 g/dL (ref 11.7–15.5)
Lymphs Abs: 2957 cells/uL (ref 850–3900)
MCH: 32.4 pg (ref 27.0–33.0)
MCHC: 34 g/dL (ref 32.0–36.0)
MCV: 95.4 fL (ref 80.0–100.0)
MPV: 10 fL (ref 7.5–12.5)
Monocytes Relative: 6.4 %
Neutro Abs: 2930 cells/uL (ref 1500–7800)
Neutrophils Relative %: 44.4 %
Platelets: 244 10*3/uL (ref 140–400)
RBC: 3.95 10*6/uL (ref 3.80–5.10)
RDW: 12.4 % (ref 11.0–15.0)
Total Lymphocyte: 44.8 %
WBC: 6.6 10*3/uL (ref 3.8–10.8)

## 2022-10-09 LAB — HEMOGLOBIN A1C
Hgb A1c MFr Bld: 5.5 % of total Hgb (ref <5.7)
Mean Plasma Glucose: 111 mg/dL
eAG (mmol/L): 6.2 mmol/L

## 2022-10-09 LAB — TSH: TSH: 1.64 mIU/L (ref 0.40–4.50)

## 2022-10-11 ENCOUNTER — Encounter: Payer: Self-pay | Admitting: Family

## 2022-10-25 ENCOUNTER — Encounter: Payer: Self-pay | Admitting: Family

## 2022-11-23 ENCOUNTER — Other Ambulatory Visit (HOSPITAL_COMMUNITY): Payer: Self-pay

## 2022-12-06 ENCOUNTER — Other Ambulatory Visit: Payer: Self-pay | Admitting: Family

## 2022-12-06 ENCOUNTER — Encounter: Payer: Self-pay | Admitting: Family

## 2022-12-06 DIAGNOSIS — M79673 Pain in unspecified foot: Secondary | ICD-10-CM

## 2022-12-20 ENCOUNTER — Ambulatory Visit: Payer: No Typology Code available for payment source | Admitting: Podiatry

## 2022-12-20 ENCOUNTER — Ambulatory Visit (INDEPENDENT_AMBULATORY_CARE_PROVIDER_SITE_OTHER): Payer: No Typology Code available for payment source

## 2022-12-20 DIAGNOSIS — M67471 Ganglion, right ankle and foot: Secondary | ICD-10-CM | POA: Diagnosis not present

## 2022-12-20 NOTE — Progress Notes (Signed)
Chief Complaint  Patient presents with   Foot Pain    Right foot pain  Place on top of foot causing pain and discomfort     HPI: 57 y.o. female presenting today as a new patient for evaluation of pain and tenderness associated to a ganglion cyst of the dorsum of the right foot.  Present for about 3 years now.  Idiopathic onset.  Patient states that she has had ganglion cyst in the past and has had them drained before.  This one is now painful in her shoes.  She is unaware of any shoes that run across the top of her foot due to the pain that elicits.  Past Medical History:  Diagnosis Date   Allergy    hayfever    Chickenpox    Hepatitis    Hyperlipidemia     Past Surgical History:  Procedure Laterality Date   BREAST BIOPSY     TONSILLECTOMY AND ADENOIDECTOMY  1973    Allergies  Allergen Reactions   Codeine Nausea Only     Physical Exam: General: The patient is alert and oriented x3 in no acute distress.  Dermatology: Skin is warm, dry and supple bilateral lower extremities.   Vascular: Palpable pedal pulses bilaterally. Capillary refill within normal limits.  No appreciable edema.  No erythema.  Neurological: Grossly intact via light touch  Musculoskeletal Exam: Ganglion cyst noted to the dorsum of the right foot with underlying hard prominence.  The ganglion cyst is about 1 cm in diameter and very fluctuant but there appears to be hard osseous prominence to the underlying cyst.  Radiographic Exam RT foot 12/20/2022:  Normal osseous mineralization. Joint spaces preserved.  No fractures or osseous irregularities noted.  Impression: Negative  Assessment/Plan of Care: 1.  Ganglion cyst right foot  -Patient evaluated.  X-rays reviewed -In the past the patient has had the cyst drained.  This 1 is now about 59 years old and we discussed possibly draining the cyst.  After long discussion of conservative versus surgical care, we decided to try and attempt to perforate the  ganglion cyst and aspirated.  5 mL of lidocaine 2% was infiltrated in the local block fashion after the foot was prepped in aseptic manner.  An 18-gauge needle was utilized to perforate the cyst and only a small amount of hyaluronic acid was drained from the area.  She continues to have a very prominent dorsum to the foot.  Dressing applied -After discussing with the patient I do believe it would be in her best interest to take her to the OR for surgical excision of the ganglion cyst with possible exostectomy of any underlying osseous prominence.  Risk benefits advantages and disadvantages as well as the recovery course were explained in great detail to the patient.  No guarantees were expressed or implied.  All patient questions answered. -Authorization for surgery was initiated today.  Surgery will consist of excision of ganglion cyst right.  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     Felecia Shelling, DPM [facility]  Dr. Felecia Shelling, DPM    [address], [place] [address]  Office [phone]  Fax [phone]

## 2022-12-21 ENCOUNTER — Encounter: Payer: Self-pay | Admitting: Podiatry

## 2023-01-04 ENCOUNTER — Telehealth: Payer: Self-pay | Admitting: Urology

## 2023-01-04 NOTE — Telephone Encounter (Signed)
DOS - 02/03/23  TARSAL EXOSTECTOMY RIGHT --- 09811 EXC GANGLION RIGHT --- 28090  [facility]/ [facility]   SPOKE WITH TRENT WITH [facility] AND HE STATED THAT FOR CPT CODES 91478 AND [phone] NO PRIOR AUTH IS REQUIRED.  CALL REF # TRENT 01/04/23 AT 2:21 PM CST

## 2023-01-25 ENCOUNTER — Encounter: Payer: Self-pay | Admitting: Podiatry

## 2023-01-27 ENCOUNTER — Telehealth: Payer: Self-pay | Admitting: Podiatry

## 2023-01-27 NOTE — Telephone Encounter (Signed)
PT CALLED [facility] TO CANCEL SURGERY. CALLED PT TO CONFIRM, SHE STATED THAT SHE WOULD CALL AT THE BEGINNING OF THE YEAR TO RESCHEDULE. ADVISED DR EVANS, [facility] ALREADY AWARE.

## 2023-02-09 ENCOUNTER — Encounter: Payer: No Typology Code available for payment source | Admitting: Podiatry

## 2023-02-16 ENCOUNTER — Encounter: Payer: No Typology Code available for payment source | Admitting: Podiatry

## 2023-03-02 ENCOUNTER — Encounter: Payer: No Typology Code available for payment source | Admitting: Podiatry

## 2023-03-21 ENCOUNTER — Other Ambulatory Visit: Payer: Self-pay | Admitting: Internal Medicine

## 2023-04-28 ENCOUNTER — Other Ambulatory Visit: Payer: Self-pay | Admitting: Internal Medicine

## 2023-04-28 DIAGNOSIS — E7801 Familial hypercholesterolemia: Secondary | ICD-10-CM

## 2023-04-28 DIAGNOSIS — E7841 Elevated Lipoprotein(a): Secondary | ICD-10-CM

## 2023-06-20 ENCOUNTER — Other Ambulatory Visit: Payer: Self-pay | Admitting: *Deleted

## 2023-06-20 DIAGNOSIS — E7801 Familial hypercholesterolemia: Secondary | ICD-10-CM

## 2023-06-29 ENCOUNTER — Other Ambulatory Visit (HOSPITAL_BASED_OUTPATIENT_CLINIC_OR_DEPARTMENT_OTHER): Payer: Self-pay | Admitting: Internal Medicine

## 2023-06-29 DIAGNOSIS — E7800 Pure hypercholesterolemia, unspecified: Secondary | ICD-10-CM

## 2023-08-09 LAB — NMR, LIPOPROFILE
Cholesterol, Total: 178 mg/dL (ref 100–199)
HDL Particle Number: 39.4 umol/L (ref >=30.5)
HDL-C: 93 mg/dL (ref >39)
LDL Particle Number: 657 nmol/L (ref <1000)
LDL Size: 21.2 nmol (ref >20.5)
LDL-C (NIH Calc): 75 mg/dL (ref 0–99)
LP-IR Score: <25 (ref <=45)
Small LDL Particle Number: <90 nmol/L (ref <=527)
Triglycerides: 46 mg/dL (ref 0–149)

## 2023-08-10 ENCOUNTER — Encounter: Payer: Self-pay | Admitting: Cardiovascular Disease

## 2023-08-16 NOTE — Progress Notes (Unsigned)
 Cardiology Office Note:  .   Date:  08/17/2023  ID:  Jennifer Hughes, DOB 1965-10-04, MRN [id number] PCP: Olive Bass, FNP  [facility] Providers Cardiologist:  None    Patient Profile: .      PMH Dyslipidemia Probable familial hyperlipidemia based on Simon Broome criteria, LDL greater than 220 untreated Elevated LP(a) Coronary artery disease CT Calcium score 07/23/21 CAC score 7.32 (76th percentile) LM 0, LAD 7.32, LCx 0, RCA 0  Referred to Advanced Lipid disorder clinic and seen by Dr. Rennis Golden 07/13/2021.  She was initially noted to have high cholesterol a number of years prior.  She saw Dr. Alwyn Ren, her PCP at the time who ordered an NMR lipid profile.  This demonstrated an LDL particle number of 885, LDL-C of 111, HDL of 90 and triglycerides 60.  Total cholesterol was 213.  At the time it was felt that her ratio was cardioprotective and no additional therapy was recommended.  She was not placed on any medications to treat her lipids and was on no medications at all.  She reported she feels she is fairly healthy and is active with exercising regularly.  She has maintained a normal body weight and has no history of hypertension or prediabetes.  Family history of heart disease in her father who had MI in his 73s and her mother who had a stroke in her 82s.  She believes her mother had high cholesterol.  She underwent CT calcium scoring 07/23/2021 which revealed CAC score of 7.32, 76 percentile for age/sex matched controls.  She was placed on high intensity rosuvastatin.  Unfortunately her LDL remained above target with less than 50% reduction and she was also found to have coronary calcification and very high LP(a).  She was started on Repatha 140 mg every 2 weeks.  At follow-up, she confirmed that she had not started Repatha but had continued on high-dose rosuvastatin.  Although she did have improvement in cholesterol the reduction was not quite the 50% there was expected from LDL of  233.  The likely explanation felt to be elevated LP(a) at 229.5.  This was discussed with her in depth by Dr. Rennis Golden and she agreed to start Repatha.  Her lipid profile improved with LDL particle number down from 1274 to 779, LDL-C 83 down from 145, HDL-C of 111 and triglycerides of 63.  Small LDL particle numbers were undetectable.  This was felt to be an overall very favorable result with LDL size 21.5 Angstrom.   Last lipid clinic appointment was 08/17/2022 with Dr. Rennis Golden.  Her lipids remain fairly stable if not somewhat better.  Her LDL particle number down to 636 from 779, LDL-C stable at 84, HDL-C 130, and small particle number remains less than 90.  She had a small decrease in LP(a) from 229.5 to 211.4 nmol/L.  Her coronary calcium CT scan the previous year showed some pulmonary nodules with 1 year follow-up recommended.  She had not yet scheduled this due to insurance issues.        History of Present Illness: .   Jennifer Hughes is a very pleasant 58 y.o. female who is here today for follow-up of dyslipidemia. Reports she is feeling well and has been adhering to her medication regimen, including self-administering injections of Repatha. She has been practicing intermittent fasting and has been mindful of her diet, which she believes may have contributed to a further decrease in her cholesterol levels. She has also been engaging in regular exercise, including working  with a personal trainer and walking at least two miles on days she does not see her trainer. She denies chest pain, shortness of breath, lower extremity edema, fatigue, palpitations, presyncope, syncope, orthopnea, and PND.   Discussed the use of AI scribe software for clinical note transcription with the patient, who gave verbal consent to proceed.   ROS: See HPI       Studies Reviewed: Marland Kitchen   EKG Interpretation Date/Time:  Wednesday August 17 2023 15:07:29 EST Ventricular Rate:  65 PR Interval:  156 QRS Duration:  74 QT  Interval:  406 QTC Calculation: 422 R Axis:   76  Text Interpretation: Normal sinus rhythm Normal ECG No previous ECGs available Confirmed by Eligha Bridegroom [phone]) on 08/17/2023 3:41:22 PM    Risk Assessment/Calculations:             Physical Exam:   VS:  BP 108/68   Pulse 67   Ht 5\' 6"  (1.676 m)   Wt 170 lb 6.4 oz (77.3 kg)   SpO2 97%   BMI 27.50 kg/m    Wt Readings from Last 3 Encounters:  08/17/23 170 lb 6.4 oz (77.3 kg)  10/08/22 172 lb 8 oz (78.2 kg)  08/17/22 172 lb 9.6 oz (78.3 kg)    GEN: Well nourished, well developed in no acute distress NECK: No JVD; No carotid bruits CARDIAC: RRR, no murmurs, rubs, gallops RESPIRATORY:  Clear to auscultation without rales, wheezing or rhonchi  ABDOMEN: Soft, non-tender, non-distended EXTREMITIES:  No edema; No deformity     ASSESSMENT AND PLAN: .    CAD: Mild coronary artery calcification with CAC score of 7.32 (76 percentile).  She remains very active and denies chest pain, dyspnea, or other symptoms concerning for angina.  No indication for further ischemic evaluation at this time. EKG reveals normal sinus rhythm, no ST abnormality. Continue to focus on secondary prevention including heart healthy mostly plant based diet avoiding saturated fat, processed foods, simple carbohydrates, and sugar along with aiming for at least 150 minutes of moderate intensity exercise each week.   Familial hyperlipidemia/LDL goal < 70/Elevated LP(a): NMR completed 08/08/2023 with LDL particle #657, LDL-C 75, HDL-C 93, triglycerides 46, total cholesterol 178, small LDL particle < 90. Overall very well controlled.  She is not having any concerning side effects from rosuvastatin or Repatha. She is asymptomatic for ASCVD. Continue Repatha 140 mg every 2 weeks and rosuvastatin 20 mg daily.  We will provide prescription refills and plan for repeat NMR in 1 year.   Lung nodule: CT 07/23/2021 with " tiny nodular density at the right lung base indeterminate but  likely an incidental finding."  We discussed the recommendation that no follow-up is needed if she is low risk.  She does not have history of smoking, exposure to secondhand smoke, or exposure to heavy air particles/carcinogens to her awareness. Offered to order repeat chest CT, however she would like to hold off for now.       Disposition:1 year with Dr. Rennis Golden or me  Signed, Eligha Bridegroom, NP-C

## 2023-08-17 ENCOUNTER — Encounter (HOSPITAL_BASED_OUTPATIENT_CLINIC_OR_DEPARTMENT_OTHER): Payer: Self-pay | Admitting: Nurse Practitioner

## 2023-08-17 ENCOUNTER — Ambulatory Visit (INDEPENDENT_AMBULATORY_CARE_PROVIDER_SITE_OTHER): Payer: No Typology Code available for payment source | Admitting: Nurse Practitioner

## 2023-08-17 VITALS — BP 108/68 | HR 67 | Ht 66.0 in | Wt 170.4 lb

## 2023-08-17 DIAGNOSIS — E7801 Familial hypercholesterolemia: Secondary | ICD-10-CM

## 2023-08-17 DIAGNOSIS — E785 Hyperlipidemia, unspecified: Secondary | ICD-10-CM

## 2023-08-17 DIAGNOSIS — I2510 Atherosclerotic heart disease of native coronary artery without angina pectoris: Secondary | ICD-10-CM | POA: Diagnosis not present

## 2023-08-17 DIAGNOSIS — E7841 Elevated Lipoprotein(a): Secondary | ICD-10-CM

## 2023-08-17 MED ORDER — ROSUVASTATIN CALCIUM 20 MG PO TABS: 20.0000 mg | ORAL_TABLET | Freq: Every day | ORAL | 3 refills | Status: DC

## 2023-08-17 MED ORDER — REPATHA SURECLICK 140 MG/ML ~~LOC~~ SOAJ
140.0000 mg | SUBCUTANEOUS | 3 refills | Status: DC
Start: 2023-08-17 — End: 2024-01-23

## 2023-08-17 NOTE — Patient Instructions (Signed)
 Medication Instructions:   Your physician recommends that you continue on your current medications as directed. Please refer to the Current Medication list given to you today.   *If you need a refill on your cardiac medications before your next appointment, please call your pharmacy*   Lab Work:  Your physician recommends that you return for a FASTING NMR 1 year prior to your next office visit.    If you have labs (blood work) drawn today and your tests are completely normal, you will receive your results only by: MyChart Message (if you have MyChart) OR A paper copy in the mail If you have any lab test that is abnormal or we need to change your treatment, we will call you to review the results.   Testing/Procedures:  None ordered.   Follow-Up: At [facility], you and your health needs are our priority.  As part of our continuing mission to provide you with exceptional heart care, we have created designated Provider Care Teams.  These Care Teams include your primary Cardiologist (physician) and Advanced Practice Providers (APPs -  Physician Assistants and Nurse Practitioners) who all work together to provide you with the care you need, when you need it.  We recommend signing up for the patient portal called "MyChart".  Sign up information is provided on this After Visit Summary.  MyChart is used to connect with patients for Virtual Visits (Telemedicine).  Patients are able to view lab/test results, encounter notes, upcoming appointments, etc.  Non-urgent messages can be sent to your provider as well.   To learn more about what you can do with MyChart, go to [email or web address].    Your next appointment:   1 year(s)  Provider:   K. Italy Hilty, MD or Eligha Bridegroom, NP    Other Instructions  Your physician wants you to follow-up in: 1 year.  You will receive a reminder letter in the mail two months in advance. If you don't receive a letter, please call our office  to schedule the follow-up appointment.

## 2023-09-25 ENCOUNTER — Other Ambulatory Visit (HOSPITAL_BASED_OUTPATIENT_CLINIC_OR_DEPARTMENT_OTHER): Payer: Self-pay | Admitting: Internal Medicine

## 2023-09-25 DIAGNOSIS — E785 Hyperlipidemia, unspecified: Secondary | ICD-10-CM

## 2023-12-09 ENCOUNTER — Encounter (HOSPITAL_BASED_OUTPATIENT_CLINIC_OR_DEPARTMENT_OTHER): Payer: Self-pay

## 2024-01-23 ENCOUNTER — Encounter (HOSPITAL_BASED_OUTPATIENT_CLINIC_OR_DEPARTMENT_OTHER): Payer: Self-pay

## 2024-01-23 DIAGNOSIS — E7801 Familial hypercholesterolemia: Secondary | ICD-10-CM

## 2024-01-23 DIAGNOSIS — E7841 Elevated Lipoprotein(a): Secondary | ICD-10-CM

## 2024-01-23 MED ORDER — REPATHA SURECLICK 140 MG/ML ~~LOC~~ SOAJ
140.0000 mg | SUBCUTANEOUS | 3 refills | Status: DC
Start: 2024-01-23 — End: 2024-02-17

## 2024-01-30 IMAGING — CT CT CARDIAC CORONARY ARTERY CALCIUM SCORE
3 series · 13 of 20 positions shown, 15 images · non-contrast
Comparison: None.

Addendum:
CLINICAL DATA: Cardiovascular Disease Risk stratification

EXAM:
Coronary Calcium Score
TECHNIQUE: A gated, non-contrast computed tomography scan of the heart was
performed using 3mm slice thickness. Axial images were analyzed on a
dedicated workstation. Calcium scoring of the coronary arteries was
performed using the Agatston method.

[Series 2: ax lung · axial · 0.79mm/px · z∈[+1239,+1351]mm · 5 of 86 slices shown]
[im 15/86  lung]
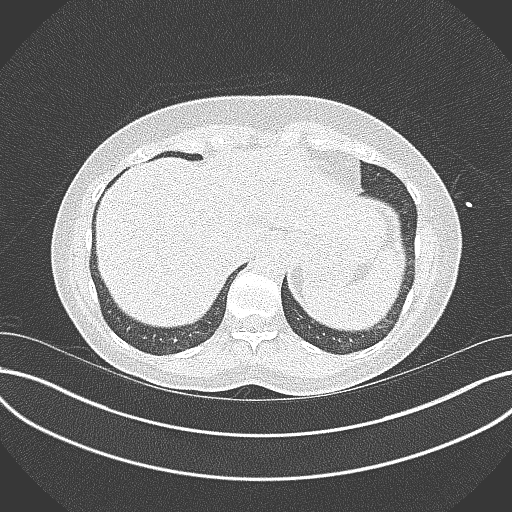
[im 29/86  lung]
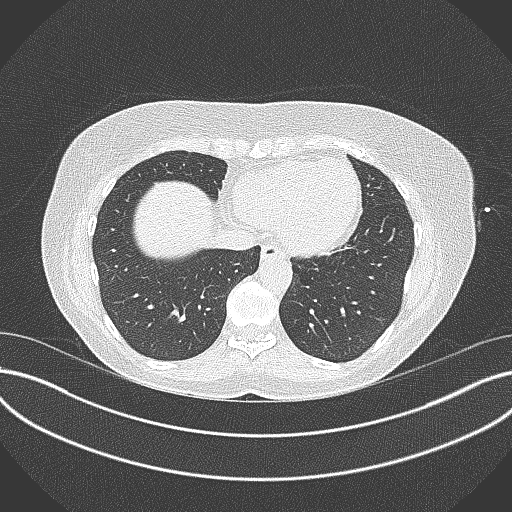
[im 43/86  lung]
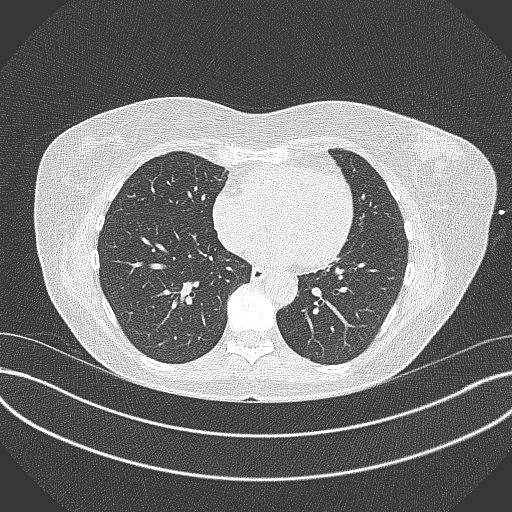
[im 57/86  lung]
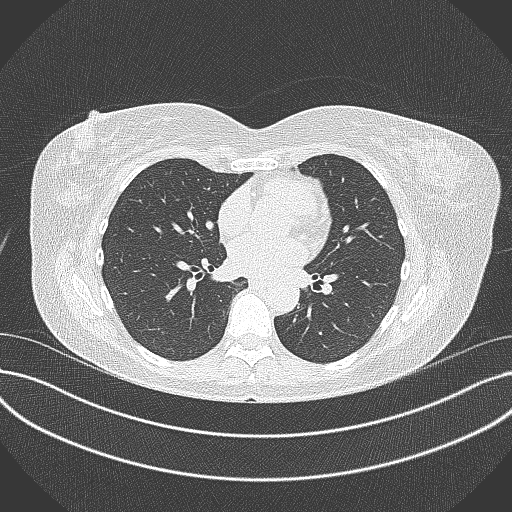
[im 71/86  lung]
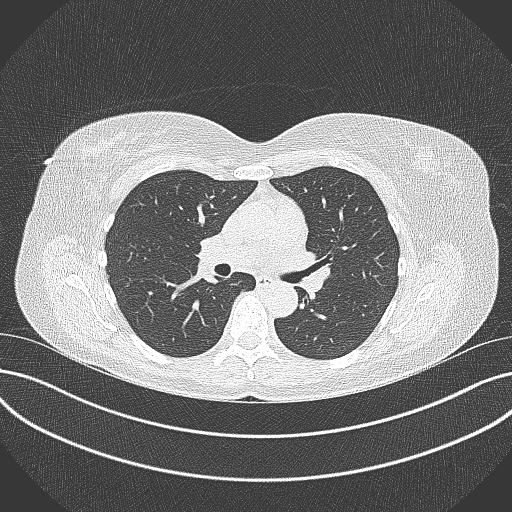

[Series 3: cascseq 3.0 sa36 70% (id) · axial · 0.39mm/px · z∈[+1254,+1296]mm · 2 of 57 slices shown]
[im 15/57  vessel]
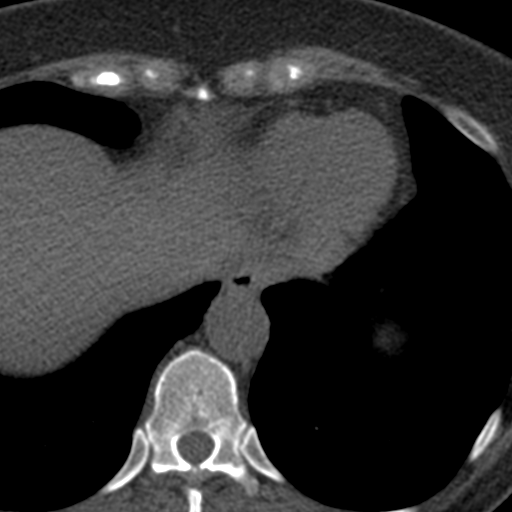
[im 29/57  vessel]
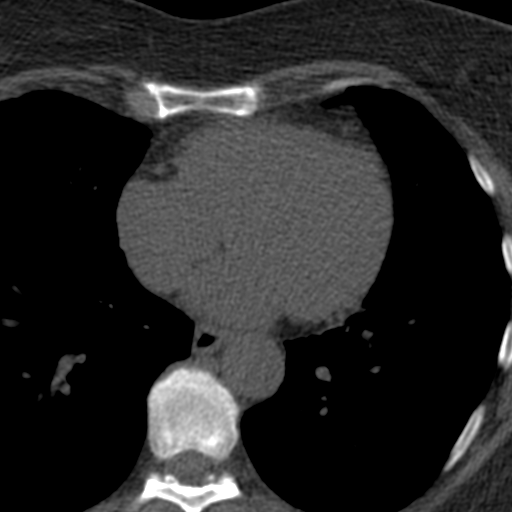

[Series 4: ax st · axial · 0.79mm/px · z∈[+1235,+1355]mm · 6 of 86 slices shown, 8 images]
[im 13/86  vessel]
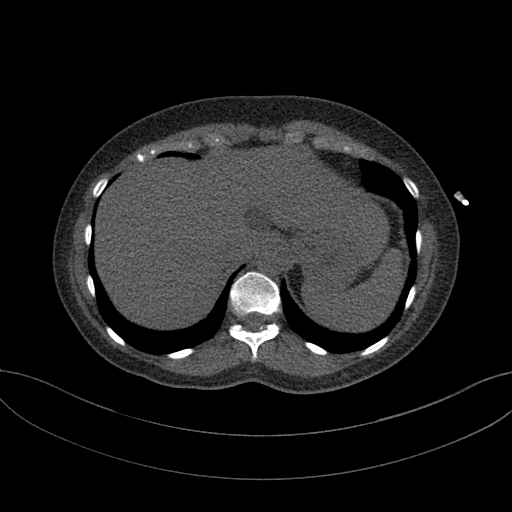
[im 13/86  lung]
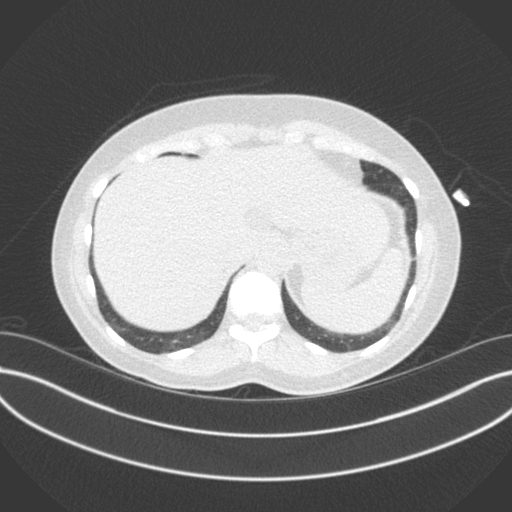
[im 25/86  vessel]
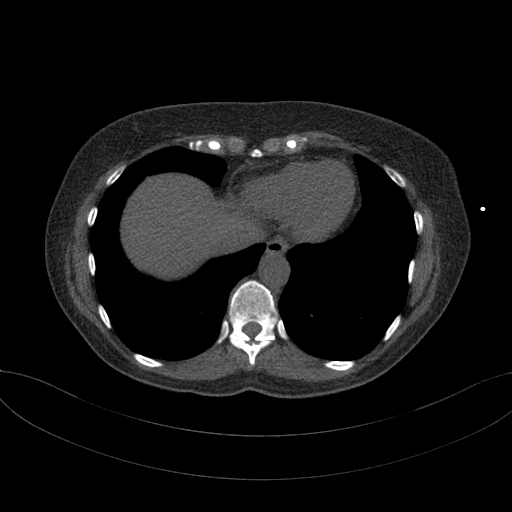
[im 37/86  vessel]
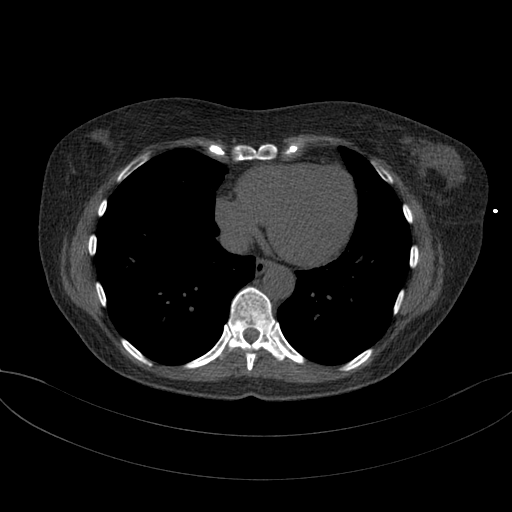
[im 49/86  vessel]
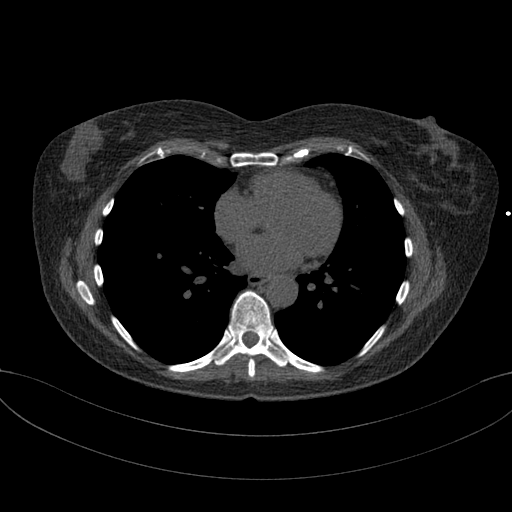
[im 61/86  vessel]
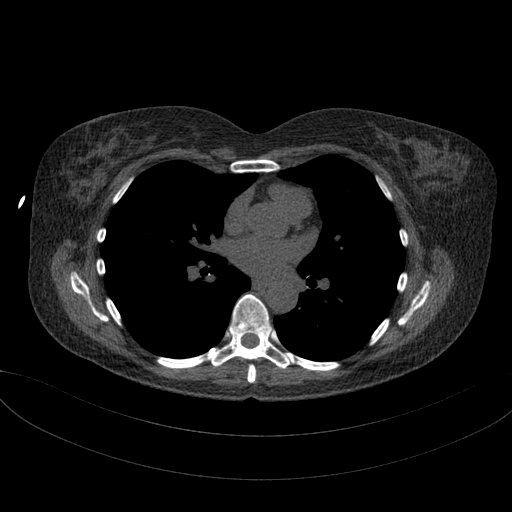
[im 61/86  lung]
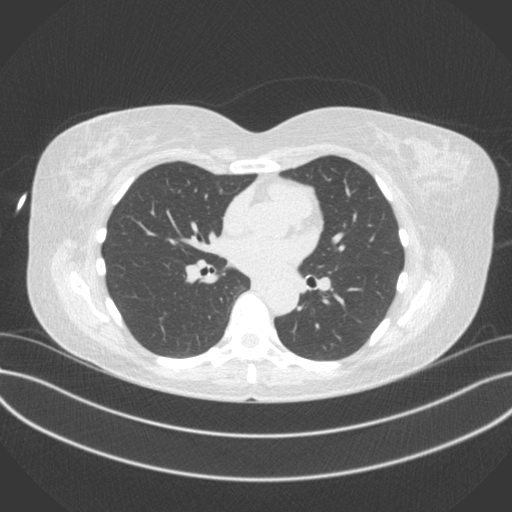
[im 73/86  vessel]
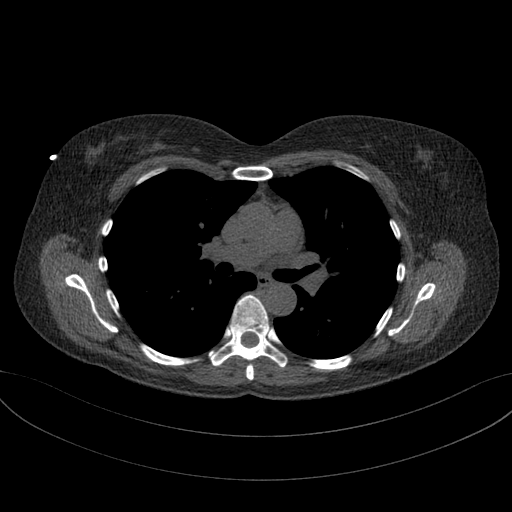

[13 of 20 positions shown; findings below may reference images not displayed]

FINDINGS: Coronary Calcium Score:

Left main: 0

Left anterior descending artery:

Left circumflex artery: 0

Right coronary artery: 0

Total:

Percentile: 76

Pericardium: Normal.

Ascending Aorta: Normal caliber.

Non-cardiac: See separate report from [REDACTED].
IMPRESSION: Coronary calcium score of 7.32. This was 76 percentile for age-,
race-, and sex-matched controls.



If CAC=0, it is reasonable to withhold statin therapy and reassess
in 5 to 10 years, as long as higher risk conditions are absent
(diabetes mellitus, family history of premature CHD in first degree
relatives (males <55 years; females <65 years), cigarette smoking,
or LDL >=190 mg/dL).

If CAC is 1 to 99, it is reasonable to initiate statin therapy for
patients >=55 years of age.

If CAC is >=100 or >=75th percentile, it is reasonable to initiate
statin therapy at any age.

Cardiology referral should be considered for patients with CAC
scores >=400 or >=75th percentile.

*9277 AHA/ACC/AACVPR/AAPA/ABC/REMON/ELSTON/AHSAN/Regino/ABEL/MANOLESKOS/BEGO
Guideline on the Management of Blood Cholesterol: A Report of the
American College of Cardiology/American Heart Association Task Force
on Clinical Practice Guidelines. J Am Coll Cardiol.
7205;73(24):6045-6707.

EXAM:
OVER-READ INTERPRETATION  CT CHEST

The following report is an over-read performed by radiologist Dr.
does not include interpretation of cardiac or coronary anatomy or
pathology. The coronary calcium score interpretation by the
cardiologist is attached.
FINDINGS: Round low-density structure along the posterior aspect of the left
hepatic lobe on sequence 4 image 85 measures up to 3.4 cm and the
Hounsfield units are suggestive for a hepatic cyst. This hepatic
cyst is incompletely imaged. Visualized mediastinal structures are
normal. 2 mm nodular density at the posterior right lung base on
sequence 2 image 80 is nonspecific. There is broad-based density
along the pleural surface in the posterior left lower lobe on image
55 that is likely an incidental finding. No significant airspace
disease or lung consolidation. No acute bone abnormality.
IMPRESSION: 1. No acute extracardiac findings.
2. Prominent low-density structure along the posterior left hepatic
lobe is suggestive for a cyst measuring up to 3.4 cm.
3. Tiny nodular density at the right lung base is indeterminate but
likely an incidental finding. No follow-up needed if patient is
low-risk. Non-contrast chest CT can be considered in 12 months if
patient is high-risk. This recommendation follows the consensus
statement: Guidelines for Management of Incidental Pulmonary Nodules
Detected on CT Images: From the [HOSPITAL] 4777; Radiology
4777; [DATE].

*** End of Addendum ***
FINDINGS: Coronary Calcium Score:

Left main: 0

Left anterior descending artery:

Left circumflex artery: 0

Right coronary artery: 0

Total:

Percentile: 76

Pericardium: Normal.

Ascending Aorta: Normal caliber.

Non-cardiac: See separate report from [REDACTED].
IMPRESSION: Coronary calcium score of 7.32. This was 76 percentile for age-,
race-, and sex-matched controls.



If CAC=0, it is reasonable to withhold statin therapy and reassess
in 5 to 10 years, as long as higher risk conditions are absent
(diabetes mellitus, family history of premature CHD in first degree
relatives (males <55 years; females <65 years), cigarette smoking,
or LDL >=190 mg/dL).

If CAC is 1 to 99, it is reasonable to initiate statin therapy for
patients >=55 years of age.

If CAC is >=100 or >=75th percentile, it is reasonable to initiate
statin therapy at any age.

Cardiology referral should be considered for patients with CAC
scores >=400 or >=75th percentile.

*9277 AHA/ACC/AACVPR/AAPA/ABC/REMON/ELSTON/AHSAN/Regino/ABEL/MANOLESKOS/BEGO
Guideline on the Management of Blood Cholesterol: A Report of the
American College of Cardiology/American Heart Association Task Force
on Clinical Practice Guidelines. J Am Coll Cardiol.
7205;73(24):6045-6707.

## 2024-02-17 ENCOUNTER — Other Ambulatory Visit: Payer: Self-pay | Admitting: Internal Medicine

## 2024-02-17 DIAGNOSIS — E7801 Familial hypercholesterolemia: Secondary | ICD-10-CM

## 2024-02-17 DIAGNOSIS — E7841 Elevated Lipoprotein(a): Secondary | ICD-10-CM

## 2024-07-06 ENCOUNTER — Other Ambulatory Visit: Payer: Self-pay | Admitting: Pharmacist

## 2024-07-06 DIAGNOSIS — E7841 Elevated Lipoprotein(a): Secondary | ICD-10-CM

## 2024-07-06 DIAGNOSIS — E78019 Familial hypercholesterolemia, unspecified: Secondary | ICD-10-CM

## 2024-07-06 MED ORDER — REPATHA SURECLICK 140 MG/ML ~~LOC~~ SOAJ: 140.0000 mg | SUBCUTANEOUS | 1 refills | Status: AC
